# Patient Record
Sex: Male | Born: 1955
Health system: Southern US, Community
[De-identification: ages and names within clinical notes are randomized; demographics above are authoritative.]

## PROBLEM LIST (undated history)

## (undated) DIAGNOSIS — N200 Calculus of kidney: Secondary | ICD-10-CM

## (undated) DIAGNOSIS — E782 Mixed hyperlipidemia: Secondary | ICD-10-CM

## (undated) DIAGNOSIS — I2510 Atherosclerotic heart disease of native coronary artery without angina pectoris: Secondary | ICD-10-CM

## (undated) DIAGNOSIS — R072 Precordial pain: Secondary | ICD-10-CM

## (undated) DIAGNOSIS — I722 Aneurysm of renal artery: Secondary | ICD-10-CM

## (undated) DIAGNOSIS — Z87442 Personal history of urinary calculi: Secondary | ICD-10-CM

## (undated) DIAGNOSIS — I10 Essential (primary) hypertension: Secondary | ICD-10-CM

## (undated) DIAGNOSIS — K635 Polyp of colon: Secondary | ICD-10-CM

## (undated) HISTORY — DX: Mixed hyperlipidemia: E78.2

## (undated) HISTORY — DX: Precordial pain: R07.2

## (undated) HISTORY — DX: Polyp of colon: K63.5

## (undated) HISTORY — PX: TONSILLECTOMY: SUR1361

## (undated) HISTORY — DX: Aneurysm of renal artery: I72.2

## (undated) HISTORY — DX: Atherosclerotic heart disease of native coronary artery without angina pectoris: I25.10

## (undated) HISTORY — DX: Essential (primary) hypertension: I10

## (undated) HISTORY — DX: Calculus of kidney: N20.0

---

## 2007-07-10 DIAGNOSIS — K635 Polyp of colon: Secondary | ICD-10-CM

## 2007-07-10 HISTORY — DX: Polyp of colon: K63.5

## 2007-07-10 HISTORY — PX: COLONOSCOPY: SHX174

## 2007-12-09 ENCOUNTER — Ambulatory Visit: Payer: Self-pay | Admitting: General Surgery

## 2012-12-17 ENCOUNTER — Encounter: Payer: Self-pay | Admitting: General Surgery

## 2013-01-07 ENCOUNTER — Encounter: Payer: Self-pay | Admitting: General Surgery

## 2013-01-07 ENCOUNTER — Ambulatory Visit (INDEPENDENT_AMBULATORY_CARE_PROVIDER_SITE_OTHER): Payer: BC Managed Care – PPO | Admitting: General Surgery

## 2013-01-07 ENCOUNTER — Other Ambulatory Visit: Payer: Self-pay | Admitting: General Surgery

## 2013-01-07 VITALS — BP 124/88 | HR 78 | Resp 12 | Ht 69.0 in | Wt 179.0 lb

## 2013-01-07 DIAGNOSIS — D128 Benign neoplasm of rectum: Secondary | ICD-10-CM

## 2013-01-07 DIAGNOSIS — Z86010 Personal history of colonic polyps: Secondary | ICD-10-CM

## 2013-01-07 DIAGNOSIS — Z1211 Encounter for screening for malignant neoplasm of colon: Secondary | ICD-10-CM

## 2013-01-07 DIAGNOSIS — D129 Benign neoplasm of anus and anal canal: Secondary | ICD-10-CM

## 2013-01-07 DIAGNOSIS — K635 Polyp of colon: Secondary | ICD-10-CM | POA: Insufficient documentation

## 2013-01-07 MED ORDER — POLYETHYLENE GLYCOL 3350 17 GM/SCOOP PO POWD: ORAL | Status: DC

## 2013-01-07 NOTE — Patient Instructions (Addendum)
Colonoscopy A colonoscopy is an exam to evaluate your entire colon. In this exam, your colon is cleansed. A long fiberoptic tube is inserted through your rectum and into your colon. The fiberoptic scope (endoscope) is a long bundle of enclosed and very flexible fibers. These fibers transmit light to the area examined and send images from that area to your caregiver. Discomfort is usually minimal. You may be given a drug to help you sleep (sedative) during or prior to the procedure. This exam helps to detect lumps (tumors), polyps, inflammation, and areas of bleeding. Your caregiver may also take a small piece of tissue (biopsy) that will be examined under a microscope. LET YOUR CAREGIVER KNOW ABOUT:   Allergies to food or medicine.  Medicines taken, including vitamins, herbs, eyedrops, over-the-counter medicines, and creams.  Use of steroids (by mouth or creams).  Previous problems with anesthetics or numbing medicines.  History of bleeding problems or blood clots.  Previous surgery.  Other health problems, including diabetes and kidney problems.  Possibility of pregnancy, if this applies. BEFORE THE PROCEDURE   A clear liquid diet may be required for 2 days before the exam.  Ask your caregiver about changing or stopping your regular medications.  Liquid injections (enemas) or laxatives may be required.  A large amount of electrolyte solution may be given to you to drink over a short period of time. This solution is used to clean out your colon.  You should be present 60 minutes prior to your procedure or as directed by your caregiver. AFTER THE PROCEDURE   If you received a sedative or pain relieving medication, you will need to arrange for someone to drive you home.  Occasionally, there is a little blood passed with the first bowel movement. Do not be concerned. FINDING OUT THE RESULTS OF YOUR TEST Not all test results are available during your visit. If your test results are  not back during the visit, make an appointment with your caregiver to find out the results. Do not assume everything is normal if you have not heard from your caregiver or the medical facility. It is important for you to follow up on all of your test results. HOME CARE INSTRUCTIONS   It is not unusual to pass moderate amounts of gas and experience mild abdominal cramping following the procedure. This is due to air being used to inflate your colon during the exam. Walking or a warm pack on your belly (abdomen) may help.  You may resume all normal meals and activities after sedatives and medicines have worn off.  Only take over-the-counter or prescription medicines for pain, discomfort, or fever as directed by your caregiver. Do not use aspirin or blood thinners if a biopsy was taken. Consult your caregiver for medicine usage if biopsies were taken. SEEK IMMEDIATE MEDICAL CARE IF:   You have a fever.  You pass large blood clots or fill a toilet with blood following the procedure. This may also occur 10 to 14 days following the procedure. This is more likely if a biopsy was taken.  You develop abdominal pain that keeps getting worse and cannot be relieved with medicine. Document Released: 06/22/2000 Document Revised: 09/17/2011 Document Reviewed: 02/05/2008 [facility] Patient Information 2014 [place], [place].  Patient has been scheduled for a colonoscopy on 01-20-13 at [facility].

## 2013-01-07 NOTE — Progress Notes (Signed)
Patient ID: Bryan Simpson, male   DOB: 12/25/1955, 57 y.o.   MRN: [id number]  Chief Complaint  Patient presents with  . Other    colonoscopy    HPI Bryan Simpson is a 57 y.o. male here today for an colonoscopy.Patient has history of colon polyps in 2009. No current problems with his bowels.   HPI  Past Medical History  Diagnosis Date  . Kidney stones   . Colon polyps 2009    Past Surgical History  Procedure Laterality Date  . Colonoscopy  2009  . Tonsillectomy  age 57    History reviewed. No pertinent family history.  Social History History  Substance Use Topics  . Smoking status: Never Smoker   . Smokeless tobacco: Never Used  . Alcohol Use: Yes    No Known Allergies  Current Outpatient Prescriptions  Medication Sig Dispense Refill  . polyethylene glycol powder (GLYCOLAX/MIRALAX) powder 255 grams one bottle for colonoscopy prep  255 g  0   No current facility-administered medications for this visit.    Review of Systems Review of Systems  Constitutional: Negative.   Respiratory: Negative.   Cardiovascular: Negative.   Gastrointestinal: Negative.     Blood pressure 124/88, pulse 78, resp. rate 12, height 5\' 9"  (1.753 m), weight 179 lb (81.194 kg).  Physical Exam Physical Exam  Constitutional: He is oriented to person, place, and time. He appears well-developed and well-nourished.  Cardiovascular: Normal rate, regular rhythm and normal heart sounds.   Pulmonary/Chest: Breath sounds normal.  Neurological: He is alert and oriented to person, place, and time.  Skin: Skin is warm and dry.    Data Reviewed Colonoscopy completed December 09, 1998 that showed a 5 mm sessile polyp in the cecum (2 adenoma without high-grade dysplasia) as well as a pedunculated 10 mm polyp in the rectosigmoid which represented a tubulovillous adenoma.  Assessment    Previous colonic polyps.     Plan    The indications for repeat colonoscopy if it reviewed. The patient amenable  to proceed.     Patient has been scheduled for a colonoscopy on 01-20-13 at [facility].    Bryan Simpson 01/07/2013, 8:26 PM

## 2013-01-20 ENCOUNTER — Ambulatory Visit: Payer: Self-pay | Admitting: General Surgery

## 2013-01-20 DIAGNOSIS — Z86010 Personal history of colonic polyps: Secondary | ICD-10-CM

## 2013-07-15 ENCOUNTER — Encounter: Payer: Self-pay | Admitting: General Surgery

## 2019-07-31 ENCOUNTER — Other Ambulatory Visit: Payer: Self-pay | Admitting: Internal Medicine

## 2019-07-31 DIAGNOSIS — R1013 Epigastric pain: Secondary | ICD-10-CM

## 2019-08-06 ENCOUNTER — Other Ambulatory Visit: Payer: Self-pay

## 2019-08-06 ENCOUNTER — Ambulatory Visit
Admission: RE | Admit: 2019-08-06 | Discharge: 2019-08-06 | Disposition: A | Discharge status: Home / Self Care | Payer: Medicare HMO | Source: Ambulatory Visit | Attending: Internal Medicine | Admitting: Internal Medicine

## 2019-08-06 DIAGNOSIS — R1013 Epigastric pain: Secondary | ICD-10-CM | POA: Diagnosis present

## 2019-08-13 ENCOUNTER — Other Ambulatory Visit: Payer: Self-pay | Admitting: General Surgery

## 2019-08-13 DIAGNOSIS — R1012 Left upper quadrant pain: Secondary | ICD-10-CM

## 2019-08-13 NOTE — Progress Notes (Signed)
[id number] 

## 2019-08-17 ENCOUNTER — Other Ambulatory Visit
Admission: RE | Admit: 2019-08-17 | Discharge: 2019-08-17 | Disposition: A | Discharge status: Home / Self Care | Payer: Medicare HMO | Source: Ambulatory Visit | Attending: General Surgery | Admitting: General Surgery

## 2019-08-17 ENCOUNTER — Other Ambulatory Visit: Payer: Self-pay | Admitting: General Surgery

## 2019-08-17 DIAGNOSIS — R1012 Left upper quadrant pain: Secondary | ICD-10-CM

## 2019-08-17 LAB — CBC WITH DIFFERENTIAL/PLATELET
Abs Immature Granulocytes: 0 10*3/uL (ref 0.00–0.07)
Basophils Absolute: 0 10*3/uL (ref 0.0–0.1)
Basophils Relative: 1 %
Eosinophils Absolute: 0.2 10*3/uL (ref 0.0–0.5)
Eosinophils Relative: 4 %
HCT: 44.2 % (ref 39.0–52.0)
Hemoglobin: 15 g/dL (ref 13.0–17.0)
Immature Granulocytes: 0 %
Lymphocytes Relative: 29 %
Lymphs Abs: 1.2 10*3/uL (ref 0.7–4.0)
MCH: 32.6 pg (ref 26.0–34.0)
MCHC: 33.9 g/dL (ref 30.0–36.0)
MCV: 96.1 fL (ref 80.0–100.0)
Monocytes Absolute: 0.5 10*3/uL (ref 0.1–1.0)
Monocytes Relative: 12 %
Neutro Abs: 2.4 10*3/uL (ref 1.7–7.7)
Neutrophils Relative %: 54 %
Platelets: 227 10*3/uL (ref 150–400)
RBC: 4.6 MIL/uL (ref 4.22–5.81)
RDW: 12 % (ref 11.5–15.5)
WBC: 4.3 10*3/uL (ref 4.0–10.5)
nRBC: 0 % (ref 0.0–0.2)

## 2019-08-17 LAB — COMPREHENSIVE METABOLIC PANEL
ALT: 18 U/L (ref 0–44)
AST: 21 U/L (ref 15–41)
Albumin: 4.1 g/dL (ref 3.5–5.0)
Alkaline Phosphatase: 41 U/L (ref 38–126)
Anion gap: 11 (ref 5–15)
BUN: 13 mg/dL (ref 8–23)
CO2: 26 mmol/L (ref 22–32)
Calcium: 9.3 mg/dL (ref 8.9–10.3)
Chloride: 99 mmol/L (ref 98–111)
Creatinine, Ser: 1.06 mg/dL (ref 0.61–1.24)
GFR calc Af Amer: >60 mL/min (ref >60)
GFR calc non Af Amer: >60 mL/min (ref >60)
Glucose, Bld: 109 mg/dL — ABNORMAL HIGH (ref 70–99)
Potassium: 4.2 mmol/L (ref 3.5–5.1)
Sodium: 136 mmol/L (ref 135–145)
Total Bilirubin: 0.7 mg/dL (ref 0.3–1.2)
Total Protein: 6.9 g/dL (ref 6.5–8.1)

## 2019-08-17 LAB — LIPASE, BLOOD: Lipase: 20 U/L (ref 11–51)

## 2019-08-17 LAB — AMYLASE: Amylase: 42 U/L (ref 28–100)

## 2019-08-17 NOTE — Progress Notes (Signed)
cbc

## 2019-08-18 ENCOUNTER — Ambulatory Visit: Admission: RE | Admit: 2019-08-18 | Payer: Medicare HMO | Source: Ambulatory Visit

## 2019-08-19 ENCOUNTER — Ambulatory Visit
Admission: RE | Admit: 2019-08-19 | Discharge: 2019-08-19 | Disposition: A | Discharge status: Home / Self Care | Payer: Medicare HMO | Source: Ambulatory Visit | Attending: General Surgery | Admitting: General Surgery

## 2019-08-19 ENCOUNTER — Other Ambulatory Visit: Payer: Self-pay

## 2019-08-19 DIAGNOSIS — R1012 Left upper quadrant pain: Secondary | ICD-10-CM | POA: Diagnosis present

## 2019-08-19 MED ORDER — IOHEXOL 300 MG/ML  SOLN
100.0000 mL | Freq: Once | INTRAMUSCULAR | Status: AC | PRN
  Administered 2019-08-19: 100 mL via INTRAVENOUS

## 2020-04-25 ENCOUNTER — Ambulatory Visit: Payer: Medicare HMO | Attending: Internal Medicine

## 2020-04-25 DIAGNOSIS — Z23 Encounter for immunization: Secondary | ICD-10-CM

## 2020-04-25 NOTE — Progress Notes (Signed)
   Covid-19 Vaccination Clinic  Name:  Bryan Simpson    MRN: [id number] DOB: 18-Feb-1956  04/25/2020  [name] was observed post Covid-19 immunization for 15 minutes without incident. He was provided with Vaccine Information Sheet and instruction to access the V-Safe system.   [name] was instructed to call 911 with any severe reactions post vaccine: [name] Difficulty breathing  . Swelling of face and throat  . A fast heartbeat  . A bad rash all over body  . Dizziness and weakness

## 2020-07-13 ENCOUNTER — Other Ambulatory Visit: Payer: Self-pay

## 2020-07-13 ENCOUNTER — Encounter: Payer: Self-pay | Admitting: Internal Medicine

## 2020-07-13 ENCOUNTER — Ambulatory Visit: Payer: Medicare HMO | Admitting: Internal Medicine

## 2020-07-13 VITALS — BP 140/102 | HR 78 | Ht 69.0 in | Wt 202.4 lb

## 2020-07-13 DIAGNOSIS — R079 Chest pain, unspecified: Secondary | ICD-10-CM

## 2020-07-13 DIAGNOSIS — I10 Essential (primary) hypertension: Secondary | ICD-10-CM

## 2020-07-13 MED ORDER — METOPROLOL TARTRATE 100 MG PO TABS: 100.0000 mg | ORAL_TABLET | Freq: Once | ORAL | 0 refills | Status: DC

## 2020-07-13 NOTE — Progress Notes (Signed)
New Outpatient Visit Date: 07/13/2020  Referring Provider: Albina Billet, MD [address]   [place],  [place] [address]  Chief Complaint: Chest pain  HPI:  Bryan Simpson is a 65 y.o. male who is being seen today as a self-referral for evaluation of chest pain. He has a history of hypertension and kidney stones.  Over the last 3 years, he has experienced intermittent chest pain under the left breast.  He reports that it is sharp without radiation or associated symptoms.  It often comes on after eating or drinking, especially alcohol.  He was previously on pantoprazole which seemed to help the symptoms.  However, over the last few weeks, he has continued to have the discomfort despite taking the pantoprazole regularly.  It also seems to be worsened when he sits up and moves his chest in certain ways.  He denies exertional chest pain as well as shortness of breath, palpitations, lightheadedness, or edema.  Bryan Simpson reports having undergone an exercise Myoview several years ago with Dr. Clayborn Bigness and believes it was normal.  He denies other heart testing or cardiac disease in the past.  --------------------------------------------------------------------------------------------------  Cardiovascular History & Procedures: Cardiovascular Problems:  Atypical chest pain  Risk Factors:  Hypertension, male gender, and age greater than 68  Cath/PCI:  None  CV Surgery:  None  EP Procedures and Devices:  None  Non-Invasive Evaluation(s):  None available.  Patient reports negative exercise myocardial perfusion stress test over 20 years ago.  Recent CV Pertinent Labs: Lab Results  Component Value Date   K 4.2 08/17/2019   BUN 13 08/17/2019   CREATININE 1.06 08/17/2019    --------------------------------------------------------------------------------------------------  Past Medical History:  Diagnosis Date  . Colon polyps 2009  . Kidney stones     Past Surgical History:   Procedure Laterality Date  . COLONOSCOPY  2009  . TONSILLECTOMY  age 65    Current Meds  Medication Sig  . lisinopril (ZESTRIL) 5 MG tablet Take 5 mg by mouth daily.  . pantoprazole (PROTONIX) 40 MG tablet Take 40 mg by mouth daily.    Allergies: Patient has no known allergies.  Social History   Tobacco Use  . Smoking status: Never Smoker  . Smokeless tobacco: Never Used  Substance Use Topics  . Alcohol use: Yes  . Drug use: No    Family History  Problem Relation Age of Onset  . Heart Problems Father     Review of Systems: A 12-system review of systems was performed and was negative except as noted in the HPI.  --------------------------------------------------------------------------------------------------  Physical Exam: BP (!) 140/102 (BP Location: Right Arm, Patient Position: Sitting, Cuff Size: Normal)   Pulse 78   Ht 5\' 9"  (1.753 m)   Wt 202 lb 6 oz (91.8 kg)   SpO2 98%   BMI 29.89 kg/m   General: NAD. HEENT: No conjunctival pallor or scleral icterus. Facemask in place. Neck: Supple without lymphadenopathy, thyromegaly, JVD, or HJR. No carotid bruit. Lungs: Normal work of breathing. Clear to auscultation bilaterally without wheezes or crackles. Heart: Regular rate and rhythm without murmurs, rubs, or gallops. Non-displaced PMI. Abd: Bowel sounds present. Soft, NT/ND without hepatosplenomegaly Ext: No lower extremity edema. Radial, PT, and DP pulses are 2+ bilaterally Skin: Warm and dry without rash. Neuro: CNIII-XII intact. Strength and fine-touch sensation intact in upper and lower extremities bilaterally. Psych: Normal mood and affect.  EKG: Normal sinus rhythm with first-degree AV block and left axis deviation.  Lab Results  Component Value Date   WBC 4.3 08/17/2019   HGB 15.0 08/17/2019   HCT 44.2 08/17/2019   MCV 96.1 08/17/2019   PLT 227 08/17/2019    Lab Results  Component Value Date   NA 136 08/17/2019   K 4.2 08/17/2019   CL 99  08/17/2019   CO2 26 08/17/2019   BUN 13 08/17/2019   CREATININE 1.06 08/17/2019   GLUCOSE 109 (H) 08/17/2019   ALT 18 08/17/2019     --------------------------------------------------------------------------------------------------  ASSESSMENT AND PLAN: Atypical chest pain: Chest pain is not typical for angina given that it seems to be worsened by alcohol consumption and certain movements.  It has also responded in the past to PPI use, though pantoprazole has not seemed to help as much recently.  Cardiac risk factors include hypertension, gender, and age.  We have discussed further evaluation options and have agreed to obtain a coronary CTA to evaluate for atherosclerotic coronary artery disease.  I will defer adding aspirin at this time given the atypical nature of the symptoms and concern that GI pathology may be contributing to his discomfort.  He should continue his PPI.  EGD should be considered if symptoms persist and cardiac CTA is unrevealing.  Hypertension: Blood pressure suboptimally controlled today but typically better.  We will defer making changes at this time, though escalation of lisinopril will need to be considered in the future if blood pressure remains above 140/90.  Follow-up: Return to clinic shortly after CTA.  Yvonne Kendall, MD 07/13/2020 2:12 PM

## 2020-07-13 NOTE — Patient Instructions (Addendum)
Medication Instructions:  Your physician recommends that you continue on your current medications as directed. Please refer to the Current Medication list given to you today.  *If you need a refill on your cardiac medications before your next appointment, please call your pharmacy*   Lab Work: Your physician recommends that you return for lab work in: TODAy - BMP.  If you have labs (blood work) drawn today and your tests are completely normal, you will receive your results only by: [name] MyChart Message (if you have MyChart) OR . A paper copy in the mail If you have any lab test that is abnormal or we need to change your treatment, we will call you to review the results.   Testing/Procedures:  Your cardiac CT will be scheduled at one of the below locations:   [facility] [address] [place], [place] [address] [phone]  [place] [address] [place], [place] [address] [phone]  If scheduled at [facility], please arrive at the [facility] main entrance of [facility] 30 minutes prior to test start time. Proceed to the [facility] Radiology Department (first floor) to check-in and test prep.  If scheduled at [facility], please arrive 15 mins early for check-in and test prep.  Please follow these instructions carefully (unless otherwise directed):  Hold all erectile dysfunction medications at least 3 days (72 hrs) prior to test.  On the Night Before the Test: . Be sure to Drink plenty of water. . Do not consume any caffeinated/decaffeinated beverages or chocolate 12 hours prior to your test. . Do not take any antihistamines 12 hours prior to your test.  On the Day of the Test: . Drink plenty of water. Do not drink any water within one hour of the test. . Do not eat any food 4 hours prior to the test. . You may take your regular medications prior to the  test.  . Take metoprolol (Lopressor) two hours prior to test. - Lopressor 100 mg tablet  After the Test: . Drink plenty of water. . After receiving IV contrast, you may experience a mild flushed feeling. This is normal. . On occasion, you may experience a mild rash up to 24 hours after the test. This is not dangerous. If this occurs, you can take Benadryl 25 mg and increase your fluid intake. . If you experience trouble breathing, this can be serious. If it is severe call 911 IMMEDIATELY. If it is mild, please call our office. . If you take any of these medications: Glipizide/Metformin, Avandament, Glucavance, please do not take 48 hours after completing test unless otherwise instructed.   Once we have confirmed authorization from your insurance company, we will call you to set up a date and time for your test. Based on how quickly your insurance processes prior authorizations requests, please allow up to 4 weeks to be contacted for scheduling your Cardiac CT appointment. Be advised that routine Cardiac CT appointments could be scheduled as many as 8 weeks after your provider has ordered it.  For non-scheduling related questions, please contact the cardiac imaging nurse navigator should you have any questions/concerns: Marchia Bond, Cardiac Imaging Nurse Navigator Burley Saver, Interim Cardiac Imaging Nurse [facility] Direct Office Dial: [phone]   For scheduling needs, including cancellations and rescheduling, please call [place], [phone].   Follow-Up: At [facility], you and your health needs are our priority.  As part of our continuing mission to provide you with exceptional heart care, we have created designated Provider Care Teams.  These Care Teams include your primary Cardiologist (physician) and Advanced Practice Providers (APPs -  Physician Assistants and Nurse Practitioners) who all work together to provide you with the care you need,  when you need it.  We recommend signing up for the patient portal called "MyChart".  Sign up information is provided on this After Visit Summary.  MyChart is used to connect with patients for Virtual Visits (Telemedicine).  Patients are able to view lab/test results, encounter notes, upcoming appointments, etc.  Non-urgent messages can be sent to your provider as well.   To learn more about what you can do with MyChart, go to [email or web address].    Your next appointment:   After Cardiac CTA approximately 4-6 weeks.  The format for your next appointment:   In Person  Provider:   You may see DR Harrell Gave END or one of the following Advanced Practice Providers on your designated Care Team:    Murray Hodgkins, NP  Christell Faith, PA-C  Marrianne Mood, PA-C  Cadence [place], [place]  Laurann Montana, NP    Cardiac CT Angiogram A cardiac CT angiogram is a procedure to look at the heart and the area around the heart. It may be done to help find the cause of chest pains or other symptoms of heart disease. During this procedure, a substance called contrast dye is injected into the blood vessels in the area to be checked. A large X-ray machine, called a CT scanner, then takes detailed pictures of the heart and the surrounding area. The procedure is also sometimes called a coronary CT angiogram, coronary artery scanning, or CTA. A cardiac CT angiogram allows the health care provider to see how well blood is flowing to and from the heart. The health care provider will be able to see if there are any problems, such as:  Blockage or narrowing of the coronary arteries in the heart.  Fluid around the heart.  Signs of weakness or disease in the muscles, valves, and tissues of the heart. Tell a health care provider about:  Any allergies you have. This is especially important if you have had a previous allergic reaction to contrast dye.  All medicines you are taking, including vitamins, herbs,  eye drops, creams, and over-the-counter medicines.  Any blood disorders you have.  Any surgeries you have had.  Any medical conditions you have.  Whether you are pregnant or may be pregnant.  Any anxiety disorders, chronic pain, or other conditions you have that may increase your stress or prevent you from lying still. What are the risks? Generally, this is a safe procedure. However, problems may occur, including:  Bleeding.  Infection.  Allergic reactions to medicines or dyes.  Damage to other structures or organs.  Kidney damage from the contrast dye that is used.  Increased risk of cancer from radiation exposure. This risk is low. Talk with your health care provider about: ? The risks and benefits of testing. ? How you can receive the lowest dose of radiation. What happens before the procedure?  Wear comfortable clothing and remove any jewelry, glasses, dentures, and hearing aids.  Follow instructions from your health care provider about eating and drinking. This may include: ? For 12 hours before the procedure -- avoid caffeine. This includes tea, coffee, soda, energy drinks, and diet pills. Drink plenty of water or other fluids that do not have caffeine  in them. Being well hydrated can prevent complications. ? For 4-6 hours before the procedure -- stop eating and drinking. The contrast dye can cause nausea, but this is less likely if your stomach is empty.  Ask your health care provider about changing or stopping your regular medicines. This is especially important if you are taking diabetes medicines, blood thinners, or medicines to treat problems with erections (erectile dysfunction). What happens during the procedure?   Hair on your chest may need to be removed so that small sticky patches called electrodes can be placed on your chest. These will transmit information that helps to monitor your heart during the procedure.  An IV will be inserted into one of your  veins.  You might be given a medicine to control your heart rate during the procedure. This will help to ensure that good images are obtained.  You will be asked to lie on an exam table. This table will slide in and out of the CT machine during the procedure.  Contrast dye will be injected into the IV. You might feel warm, or you may get a metallic taste in your mouth.  You will be given a medicine called nitroglycerin. This will relax or dilate the arteries in your heart.  The table that you are lying on will move into the CT machine tunnel for the scan.  The person running the machine will give you instructions while the scans are being done. You may be asked to: ? Keep your arms above your head. ? Hold your breath. ? Stay very still, even if the table is moving.  When the scanning is complete, you will be moved out of the machine.  The IV will be removed. The procedure may vary among health care providers and hospitals. What can I expect after the procedure? After your procedure, it is common to have:  A metallic taste in your mouth from the contrast dye.  A feeling of warmth.  A headache from the nitroglycerin. Follow these instructions at home:  Take over-the-counter and prescription medicines only as told by your health care provider.  If you are told, drink enough fluid to keep your urine pale yellow. This will help to flush the contrast dye out of your body.  Most people can return to their normal activities right after the procedure. Ask your health care provider what activities are safe for you.  It is up to you to get the results of your procedure. Ask your health care provider, or the department that is doing the procedure, when your results will be ready.  Keep all follow-up visits as told by your health care provider. This is important. Contact a health care provider if:  You have any symptoms of allergy to the contrast dye. These include: ? Shortness of  breath. ? Rash or hives. ? A racing heartbeat. Summary  A cardiac CT angiogram is a procedure to look at the heart and the area around the heart. It may be done to help find the cause of chest pains or other symptoms of heart disease.  During this procedure, a large X-ray machine, called a CT scanner, takes detailed pictures of the heart and the surrounding area after a contrast dye has been injected into blood vessels in the area.  Ask your health care provider about changing or stopping your regular medicines before the procedure. This is especially important if you are taking diabetes medicines, blood thinners, or medicines to treat erectile dysfunction.  If  you are told, drink enough fluid to keep your urine pale yellow. This will help to flush the contrast dye out of your body. This information is not intended to replace advice given to you by your health care provider. Make sure you discuss any questions you have with your health care provider. Document Revised: 02/18/2019 Document Reviewed: 02/18/2019 Elsevier Patient Education  [employer].

## 2020-07-14 ENCOUNTER — Encounter: Payer: Self-pay | Admitting: Internal Medicine

## 2020-07-14 DIAGNOSIS — I10 Essential (primary) hypertension: Secondary | ICD-10-CM | POA: Insufficient documentation

## 2020-07-14 DIAGNOSIS — R079 Chest pain, unspecified: Secondary | ICD-10-CM | POA: Insufficient documentation

## 2020-07-14 LAB — BASIC METABOLIC PANEL WITH GFR
BUN/Creatinine Ratio: 9 — ABNORMAL LOW (ref 10–24)
BUN: 13 mg/dL (ref 8–27)
CO2: 24 mmol/L (ref 20–29)
Calcium: 10 mg/dL (ref 8.6–10.2)
Chloride: 103 mmol/L (ref 96–106)
Creatinine, Ser: 1.38 mg/dL — ABNORMAL HIGH (ref 0.76–1.27)
GFR calc Af Amer: 61 mL/min/1.73
GFR calc non Af Amer: 53 mL/min/1.73 — ABNORMAL LOW
Glucose: 111 mg/dL — ABNORMAL HIGH (ref 65–99)
Potassium: 4.7 mmol/L (ref 3.5–5.2)
Sodium: 142 mmol/L (ref 134–144)

## 2020-07-15 NOTE — Addendum Note (Signed)
Addended by: Britt Bottom on: 07/15/2020 01:34 PM   Modules accepted: Orders

## 2020-07-22 ENCOUNTER — Encounter: Payer: Self-pay | Admitting: Internal Medicine

## 2020-07-22 DIAGNOSIS — I10 Essential (primary) hypertension: Secondary | ICD-10-CM | POA: Diagnosis not present

## 2020-07-22 DIAGNOSIS — Z125 Encounter for screening for malignant neoplasm of prostate: Secondary | ICD-10-CM | POA: Diagnosis not present

## 2020-07-22 DIAGNOSIS — E785 Hyperlipidemia, unspecified: Secondary | ICD-10-CM | POA: Diagnosis not present

## 2020-07-22 DIAGNOSIS — Z0000 Encounter for general adult medical examination without abnormal findings: Secondary | ICD-10-CM | POA: Diagnosis not present

## 2020-08-02 ENCOUNTER — Telehealth (HOSPITAL_COMMUNITY): Payer: Self-pay | Admitting: *Deleted

## 2020-08-02 NOTE — Telephone Encounter (Signed)
Attempted to call patient regarding upcoming cardiac CT appointment. Left message on voicemail with name and callback number  Michelle Vanhise RN Navigator Cardiac Imaging [facility] [phone] Office [phone] Cell  

## 2020-08-04 ENCOUNTER — Other Ambulatory Visit: Payer: Self-pay

## 2020-08-04 ENCOUNTER — Ambulatory Visit
Admission: RE | Admit: 2020-08-04 | Discharge: 2020-08-04 | Disposition: A | Discharge status: Home / Self Care | Payer: Medicare HMO | Source: Ambulatory Visit | Attending: Internal Medicine | Admitting: Internal Medicine

## 2020-08-04 DIAGNOSIS — I2510 Atherosclerotic heart disease of native coronary artery without angina pectoris: Secondary | ICD-10-CM | POA: Diagnosis not present

## 2020-08-04 DIAGNOSIS — R079 Chest pain, unspecified: Secondary | ICD-10-CM | POA: Diagnosis present

## 2020-08-04 HISTORY — DX: Personal history of urinary calculi: Z87.442

## 2020-08-04 MED ORDER — IOHEXOL 350 MG/ML SOLN
75.0000 mL | Freq: Once | INTRAVENOUS | Status: AC | PRN
  Administered 2020-08-04: 75 mL via INTRAVENOUS

## 2020-08-04 MED ORDER — NITROGLYCERIN 0.4 MG SL SUBL
0.8000 mg | SUBLINGUAL_TABLET | Freq: Once | SUBLINGUAL | Status: AC
  Administered 2020-08-04: 0.8 mg via SUBLINGUAL

## 2020-08-04 NOTE — Progress Notes (Signed)
Patient tolerated procedure well. Ambulate w/o difficulty. Sitting in chair drinking water provided. Encouraged to drink extra water today and reasoning explained. Verbalized understanding. All questions answered. ABC intact. No further needs. Discharge from procedure area w/o issues.  

## 2020-08-12 ENCOUNTER — Other Ambulatory Visit: Payer: Self-pay

## 2020-08-12 ENCOUNTER — Ambulatory Visit: Payer: Medicare HMO | Admitting: Internal Medicine

## 2020-08-12 ENCOUNTER — Encounter: Payer: Self-pay | Admitting: Internal Medicine

## 2020-08-12 VITALS — BP 138/98 | HR 73 | Ht 69.0 in | Wt 206.2 lb

## 2020-08-12 DIAGNOSIS — I10 Essential (primary) hypertension: Secondary | ICD-10-CM

## 2020-08-12 DIAGNOSIS — I2510 Atherosclerotic heart disease of native coronary artery without angina pectoris: Secondary | ICD-10-CM | POA: Diagnosis not present

## 2020-08-12 DIAGNOSIS — R079 Chest pain, unspecified: Secondary | ICD-10-CM

## 2020-08-12 NOTE — Progress Notes (Signed)
Follow-up Outpatient Visit Date: 08/12/2020  Primary Care Provider: Albina Billet, MD [address]   [place] [address]  Chief Complaint: Chest pain  HPI:  Bryan Simpson is a 65 y.o. male with history of hypertension and kidney stone, who presents for follow-up of chest pain.  I met him last month for evaluation of atypical chest pain.  Coronary CTA last week showed moderate coronary artery calcification and mild, nonobstructive CAD.  Today, Bryan Simpson continues to have pain in the left side of his chest along the sternum and lower costal margin.  It seems to worsen with certain movements as well as eating.  He recently noticed worsening pain after eating peanuts.  He denies shortness of breath, palpitations, lightheadedness, and edema.  He hopes to lose some weight and thinks this may help some of his pain.  --------------------------------------------------------------------------------------------------  Cardiovascular History & Procedures: Cardiovascular Problems:  Atypical chest pain  Risk Factors:  Hypertension, male gender, and age greater than 63  Cath/PCI:  None  CV Surgery:  None  EP Procedures and Devices:  None  Non-Invasive Evaluation(s):  Coronary CTA (08/04/2020): LMCA normal.  LAD with proximal and mid calcification with less than 25% stenosis.  Nondominant LCx without plaque.  Dominant RCA with minimal calcification and less than 25% stenosis.  CAC score 253 (69th percentile).  Recent CV Pertinent Labs: Lab Results  Component Value Date   K 4.7 07/13/2020   BUN 13 07/13/2020   CREATININE 1.38 (H) 07/13/2020    Past medical and surgical history were reviewed and updated in EPIC.  Current Meds  Medication Sig  . lisinopril (ZESTRIL) 5 MG tablet Take 5 mg by mouth daily.  . pantoprazole (PROTONIX) 40 MG tablet Take 40 mg by mouth daily.    Allergies: Patient has no known allergies.  Social History   Tobacco Use  . Smoking status:  Never Smoker  . Smokeless tobacco: Never Used  Substance Use Topics  . Alcohol use: Yes    Alcohol/week: 1.0 standard drink    Types: 1 Glasses of wine per week  . Drug use: No    Family History  Problem Relation Age of Onset  . Heart Problems Father 62       Heart failure    Review of Systems: A 12-system review of systems was performed and was negative except as noted in the HPI.  --------------------------------------------------------------------------------------------------  Physical Exam: BP (!) 138/98 (BP Location: Left Arm, Patient Position: Sitting, Cuff Size: Normal)   Pulse 73   Ht $R'5\' 9"'bZ$  (1.753 m)   Wt 206 lb 4 oz (93.6 kg)   SpO2 99%   BMI 30.46 kg/m   General:  NAD. Neck: No JVD or HJR. Lungs: Clear to auscultation bilaterally without wheezes or crackles. Heart: Regular rate and rhythm without murmurs, rubs, or gallops. Abdomen: Soft, nontender, nondistended. Extremities: No lower extremity edema.  EKG:  NSR with borderline 1st degree AV block (PR interval 216 ms) and left axis deviation.  No change from prior tracing on 07/13/20.  Lab Results  Component Value Date   WBC 4.3 08/17/2019   HGB 15.0 08/17/2019   HCT 44.2 08/17/2019   MCV 96.1 08/17/2019   PLT 227 08/17/2019    Lab Results  Component Value Date   NA 142 07/13/2020   K 4.7 07/13/2020   CL 103 07/13/2020   CO2 24 07/13/2020   BUN 13 07/13/2020   CREATININE 1.38 (H) 07/13/2020   GLUCOSE 111 (H)  07/13/2020   ALT 18 08/17/2019    No results found for: CHOL, HDL, LDLCALC, LDLDIRECT, TRIG, CHOLHDL  --------------------------------------------------------------------------------------------------  ASSESSMENT AND PLAN: Chest pain: Pain is atypical and most likely MSK or GI in nature.  Recent cardiac CTA showed mild, non-obstructive CAD.  I have encouraged Bryan Simpson to continue taking pantoprazole and to follow-up with Dr. Hall Busing and his gastroenterologist for further evaluation of the  chest pain if it does not improve.  Hypertension: BP mildly elevated today.  I have encouraged Bryan Simpson to minimize his sodium intake as well as to work on weight loss through diet and exercise.  We will defer medication changes today.  CAD: Mild coronary plaquing noted (<25% stenosis) on recent coronary CTA.  We have agreed to work on lifestyle modifications and consider addition of a statin in the future based on response.  Target LDL <100; we will plan to check a fasting lipid panel and CMP shortly before our next visit in ~6 months.  Follow-up: Return to clinic in 6 months.  Nelva Bush, MD 08/12/2020 3:16 PM

## 2020-08-12 NOTE — Patient Instructions (Signed)
Medication Instructions:  No changes  *If you need a refill on your cardiac medications before your next appointment, please call your pharmacy*   Lab Work: Lipid panel & CMP to be done before next visit.  Make sure to not eat or drink anything after midnight prior to having them done. Go to [facility] entrance to have these done and no appointment is needed for this.    If you have labs (blood work) drawn today and your tests are completely normal, you will receive your results only by: [name] MyChart Message (if you have MyChart) OR . A paper copy in the mail If you have any lab test that is abnormal or we need to change your treatment, we will call you to review the results.   Testing/Procedures: None   Follow-Up: At [facility], you and your health needs are our priority.  As part of our continuing mission to provide you with exceptional heart care, we have created designated Provider Care Teams.  These Care Teams include your primary Cardiologist (physician) and Advanced Practice Providers (APPs -  Physician Assistants and Nurse Practitioners) who all work together to provide you with the care you need, when you need it.  Your next appointment:   6 month(s)  The format for your next appointment:   In Person  Provider:   You may see Dr. Harrell Gave End or one of the following Advanced Practice Providers on your designated Care Team:    Murray Hodgkins, NP  Christell Faith, PA-C  Marrianne Mood, PA-C  Cadence Norwood, [place]  Laurann Montana, NP

## 2020-08-14 ENCOUNTER — Encounter: Payer: Self-pay | Admitting: Internal Medicine

## 2020-08-14 DIAGNOSIS — I2510 Atherosclerotic heart disease of native coronary artery without angina pectoris: Secondary | ICD-10-CM | POA: Insufficient documentation

## 2020-08-24 NOTE — Addendum Note (Signed)
Addended by: Britt Bottom on: 08/24/2020 07:26 AM   Modules accepted: Orders

## 2020-11-18 DIAGNOSIS — M71341 Other bursal cyst, right hand: Secondary | ICD-10-CM | POA: Diagnosis not present

## 2020-11-18 DIAGNOSIS — Z08 Encounter for follow-up examination after completed treatment for malignant neoplasm: Secondary | ICD-10-CM | POA: Diagnosis not present

## 2020-11-18 DIAGNOSIS — L821 Other seborrheic keratosis: Secondary | ICD-10-CM | POA: Diagnosis not present

## 2020-11-18 DIAGNOSIS — D2272 Melanocytic nevi of left lower limb, including hip: Secondary | ICD-10-CM | POA: Diagnosis not present

## 2020-11-18 DIAGNOSIS — D2271 Melanocytic nevi of right lower limb, including hip: Secondary | ICD-10-CM | POA: Diagnosis not present

## 2020-11-18 DIAGNOSIS — D2262 Melanocytic nevi of left upper limb, including shoulder: Secondary | ICD-10-CM | POA: Diagnosis not present

## 2020-11-18 DIAGNOSIS — D2261 Melanocytic nevi of right upper limb, including shoulder: Secondary | ICD-10-CM | POA: Diagnosis not present

## 2020-11-18 DIAGNOSIS — Z872 Personal history of diseases of the skin and subcutaneous tissue: Secondary | ICD-10-CM | POA: Diagnosis not present

## 2020-11-18 DIAGNOSIS — Z85828 Personal history of other malignant neoplasm of skin: Secondary | ICD-10-CM | POA: Diagnosis not present

## 2020-11-18 DIAGNOSIS — D225 Melanocytic nevi of trunk: Secondary | ICD-10-CM | POA: Diagnosis not present

## 2021-01-02 ENCOUNTER — Encounter: Payer: Self-pay | Admitting: Internal Medicine

## 2021-01-02 DIAGNOSIS — E785 Hyperlipidemia, unspecified: Secondary | ICD-10-CM | POA: Diagnosis not present

## 2021-01-10 DIAGNOSIS — I10 Essential (primary) hypertension: Secondary | ICD-10-CM | POA: Diagnosis not present

## 2021-01-10 DIAGNOSIS — E785 Hyperlipidemia, unspecified: Secondary | ICD-10-CM | POA: Diagnosis not present

## 2021-01-19 ENCOUNTER — Other Ambulatory Visit: Payer: Self-pay | Admitting: General Surgery

## 2021-01-19 DIAGNOSIS — R1013 Epigastric pain: Secondary | ICD-10-CM | POA: Diagnosis not present

## 2021-01-19 DIAGNOSIS — I722 Aneurysm of renal artery: Secondary | ICD-10-CM | POA: Diagnosis not present

## 2021-01-19 DIAGNOSIS — Z86010 Personal history of colonic polyps: Secondary | ICD-10-CM | POA: Diagnosis not present

## 2021-01-19 NOTE — Progress Notes (Signed)
Subjective:     Patient ID: Bryan Simpson is a 65 y.o. male.   HPI   The following portions of the patient's history were reviewed and updated as appropriate.   This an established patient is here today for: office visit. The patient is here today for a colonoscopy discussion. He reports he has bowel movements every 3 days. Patient denies any rectal bleeding. The patient had his last colonoscopy done on 01-20-13.    The patient reports improvement in epigastric discomfort since initiation of PPI.  Symptomatic with epigastric discomfort if he misses a dose.   Patient was seen in March 2011 in follow-up from an exam the month earlier.  He initially was reporting left epigastric pain that has been present over 3 years.  Nexium did not provide any relief.  No nausea or vomiting.  Some mild dysphagia noted at that time.   CT scan notable only for renal cysts as well as a 1.7 cm right renal artery aneurysm.        Chief Complaint  Patient presents with   Pre-op Exam      BP (!) 152/88   Pulse 66   Temp 36.6 C (97.9 F)   Ht 175.3 cm ($RemoveB'5\' 9"'wvsQtDiz$ )   Wt 89.8 kg (198 lb)   SpO2 99%   BMI 29.24 kg/m        Past Medical History:  Diagnosis Date   Adenomatous polyp of sigmoid colon 12/09/2007    5 mm sessile polyp in the cecum tubular adenoma without high-grade dysplasia) as well as a pedunculated 10 mm polyp in the rectosigmoid which represented a tubulovillous adenoma   History of kidney stones     Hypertension             Past Surgical History:  Procedure Laterality Date   COLONOSCOPY   2009   COLONOSCOPY   2014   TONSILLECTOMY        age 83          Social History           Socioeconomic History   Marital status: Married  Tobacco Use   Smoking status: Never Smoker   Smokeless tobacco: Never Used  Substance and Sexual Activity   Alcohol use: Yes      Comment: occasionally   Drug use: Never        No Known Allergies   Current Medications        Current  Outpatient Medications  Medication Sig Dispense Refill   lisinopriL (ZESTRIL) 5 MG tablet Take 5 mg by mouth once daily       pantoprazole (PROTONIX) 40 MG DR tablet Take 40 mg by mouth once daily       rosuvastatin (CRESTOR) 10 MG tablet Take 10 mg by mouth every evening        No current facility-administered medications for this visit.             Family History  Problem Relation Age of Onset   Ovarian cancer Mother     Other Father 29        heart problems   Brain cancer Sister     Melanoma Sister            Review of Systems  Constitutional: Negative for chills and fever.  Respiratory: Negative for cough.          Objective:   Physical Exam Constitutional:      Appearance: Normal appearance.  Cardiovascular:  Rate and Rhythm: Normal rate and regular rhythm.     Pulses: Normal pulses.     Heart sounds: Normal heart sounds.  Pulmonary:     Effort: Pulmonary effort is normal.     Breath sounds: Normal breath sounds.  Neurological:     Mental Status: He is alert and oriented to person, place, and time.  Psychiatric:        Mood and Affect: Mood normal.        Behavior: Behavior normal.          Labs and Radiology:    January 20, 2013 colonoscopy was normal.  The exam was completed for a personal history of colon polyps.     July 13, 2020 laboratory:   Component Ref Range & Units 6 mo ago 1 yr ago  Glucose 65 - 99 mg/dL 111 High   109 High  R   BUN 8 - 27 mg/dL 13  13 R   Creatinine, Ser 0.76 - 1.27 mg/dL 1.38 High   1.06 R   GFR calc non Af Amer >59 mL/min/1.73 53 Low   >60 R   GFR calc Af Amer >59 mL/min/1.73 61  >60 R   Comment: **In accordance with recommendations from the NKF-ASN Task force,**    [facility] is in the process of updating its eGFR calculation to the    2021 CKD-EPI creatinine equation that estimates kidney function    without a race variable.   BUN/Creatinine Ratio 10 - 24 9 Low      Sodium 134 - 144 mmol/L 142  136 R   Potassium  3.5 - 5.2 mmol/L 4.7  4.2 R   Chloride 96 - 106 mmol/L 103  99 R   CO2 20 - 29 mmol/L 24  26 R   Calcium 8.6 - 10.2 mg/dL 10.0  9.3 R          Assessment:     Candidate for follow-up colonoscopy based on 2009 exam and identification of a tubular adenoma.  Normal exam in 2014.   Candidate for upper endoscopy based on longstanding epigastric discomfort ameliorated in part by PPI therapy.    Plan:     Risks and benefits of endoscopy reviewed.    This note is partially prepared by Ledell Noss, CMA acting as a scribe in the presence of Dr. Hervey Ard, MD.    The documentation recorded by the scribe accurately reflects the service I personally performed and the decisions made by me.    Robert Bellow, MD FACS

## 2021-02-06 ENCOUNTER — Other Ambulatory Visit (INDEPENDENT_AMBULATORY_CARE_PROVIDER_SITE_OTHER): Payer: Self-pay | Admitting: Vascular Surgery

## 2021-02-06 DIAGNOSIS — I722 Aneurysm of renal artery: Secondary | ICD-10-CM

## 2021-02-07 ENCOUNTER — Ambulatory Visit (INDEPENDENT_AMBULATORY_CARE_PROVIDER_SITE_OTHER): Payer: Medicare HMO

## 2021-02-07 ENCOUNTER — Other Ambulatory Visit: Payer: Self-pay

## 2021-02-07 ENCOUNTER — Ambulatory Visit (INDEPENDENT_AMBULATORY_CARE_PROVIDER_SITE_OTHER): Payer: Medicare HMO | Admitting: Nurse Practitioner

## 2021-02-07 ENCOUNTER — Encounter (INDEPENDENT_AMBULATORY_CARE_PROVIDER_SITE_OTHER): Payer: Self-pay | Admitting: Nurse Practitioner

## 2021-02-07 VITALS — BP 139/83 | HR 62 | Ht 69.0 in | Wt 197.0 lb

## 2021-02-07 DIAGNOSIS — I722 Aneurysm of renal artery: Secondary | ICD-10-CM

## 2021-02-07 DIAGNOSIS — I10 Essential (primary) hypertension: Secondary | ICD-10-CM

## 2021-02-07 NOTE — Progress Notes (Signed)
Subjective:    Patient ID: Bryan Simpson, male    DOB: March 24, 1956, 65 y.o.   MRN: [id number] Chief Complaint  Patient presents with   New Patient (Initial Visit)    Np renal and consult patient notes 1.7 cm right renal artery aneurysm on CT 08/2019. Asymptomatic. Assessment, recommendations for FU requested by byrnett jeffery     Bryan Simpson is a 65 year old male that presents today for follow-up evaluation of a renal artery aneurysm.  This was initially found by CT scan in February 2021.  Today the patient has good blood pressure control.  Overall the aneurysm has been asymptomatic.  Today the patient had a follow-up ultrasound to evaluate for possible renal artery aneurysm however none was visualized.  It did note that there was normal size kidneys bilaterally.  No evidence of renal artery stenosis bilaterally.  Normal cortical thickness was also seen bilaterally.  There was a 3.5 cm simple cyst noted in the right upper pole however this is also consistent with the previous CT results as well.   Review of Systems  All other systems reviewed and are negative.     Objective:   Physical Exam Vitals reviewed.  HENT:     Head: Normocephalic.  Cardiovascular:     Rate and Rhythm: Normal rate.  Pulmonary:     Effort: Pulmonary effort is normal.  Neurological:     Mental Status: He is alert and oriented to person, place, and time.  Psychiatric:        Mood and Affect: Mood normal.        Behavior: Behavior normal.        Thought Content: Thought content normal.        Judgment: Judgment normal.    BP 139/83   Pulse 62   Ht '5\' 9"'$  (1.753 m)   Wt 197 lb (89.4 kg)   BMI 29.09 kg/m   Past Medical History:  Diagnosis Date   Colon polyps 2009   History of kidney stones    Hypertension     Social History   Socioeconomic History   Marital status: Married    Spouse name: Not on file   Number of children: Not on file   Years of education: Not on file   Highest education  level: Not on file  Occupational History   Not on file  Tobacco Use   Smoking status: Never   Smokeless tobacco: Never  Substance and Sexual Activity   Alcohol use: Yes    Alcohol/week: 1.0 standard drink    Types: 1 Glasses of wine per week   Drug use: No   Sexual activity: Not on file  Other Topics Concern   Not on file  Social History Narrative   Not on file   Social Determinants of Health   Financial Resource Strain: Not on file  Food Insecurity: Not on file  Transportation Needs: Not on file  Physical Activity: Not on file  Stress: Not on file  Social Connections: Not on file  Intimate Partner Violence: Not on file    Past Surgical History:  Procedure Laterality Date   COLONOSCOPY  2009   TONSILLECTOMY  age 56    Family History  Problem Relation Age of Onset   Heart Problems Father 26       Heart failure    No Known Allergies  CBC Latest Ref Rng & Units 08/17/2019  WBC 4.0 - 10.5 K/uL 4.3  Hemoglobin 13.0 - 17.0 g/dL 15.0  Hematocrit 39.0 - 52.0 % 44.2  Platelets 150 - 400 K/uL 227      CMP     Component Value Date/Time   NA 142 07/13/2020 1444   K 4.7 07/13/2020 1444   CL 103 07/13/2020 1444   CO2 24 07/13/2020 1444   GLUCOSE 111 (H) 07/13/2020 1444   GLUCOSE 109 (H) 08/17/2019 1416   BUN 13 07/13/2020 1444   CREATININE 1.38 (H) 07/13/2020 1444   CALCIUM 10.0 07/13/2020 1444   PROT 6.9 08/17/2019 1416   ALBUMIN 4.1 08/17/2019 1416   AST 21 08/17/2019 1416   ALT 18 08/17/2019 1416   ALKPHOS 41 08/17/2019 1416   BILITOT 0.7 08/17/2019 1416   GFRNONAA 53 (L) 07/13/2020 1444   GFRAA 61 07/13/2020 1444     No results found.     Assessment & Plan:   1. Renal artery aneurysm (HCC) Today duplex did not reveal any evidence of a renal artery aneurysm.  However that is not necessarily mean that the aneurysm is not there resolved.  In many instances renal artery aneurysms can be difficult to view via ultrasound.  I discussed the renal artery  aneurysm side with patient 1.7 cm is at a relatively low risk of rupture.  The patient also has good blood pressure control, making this aneurysm asymptomatic.  Typically we do not plan to intervene until about 2.5 cm.  It has been approximately 1-1/2 years since this was actually visualized.  In order for better risk assessment we will obtain a CT scan.  The previous CT scan also notes renal cysts which were seen today.  Following the CT scan we will follow-up with the patient for neck steps.  If the aneurysm remains around the same size of 1.7 cm we will plan on annual follow-up.  However if it has shown either substantial growth or reached the 2.5 cm mark we may discuss intervention. - CT Angio Abd/Pel w/ and/or w/o; Future - Creatinine, IStat; Future  2. Essential hypertension Continue antihypertensive medications as already ordered, these medications have been reviewed and there are no changes at this time.    Current Outpatient Medications on File Prior to Visit  Medication Sig Dispense Refill   bisacodyl (DULCOLAX) 5 MG EC tablet Take two tablets morning and two tablets afternoon day prior to Miralax prep.     lisinopril (ZESTRIL) 5 MG tablet Take 5 mg by mouth daily.     pantoprazole (PROTONIX) 40 MG tablet Take 40 mg by mouth daily.     polyethylene glycol powder (GLYCOLAX/MIRALAX) 17 GM/SCOOP powder One bottle for colonoscopy prep. Use as directed.     rosuvastatin (CRESTOR) 10 MG tablet SMARTSIG:1 Tablet(s) By Mouth Every Evening     No current facility-administered medications on file prior to visit.    There are no Patient Instructions on file for this visit. No follow-ups on file.   Kris Hartmann, NP

## 2021-02-14 ENCOUNTER — Encounter: Payer: Self-pay | Admitting: General Surgery

## 2021-02-15 ENCOUNTER — Ambulatory Visit: Payer: Medicare HMO | Admitting: Internal Medicine

## 2021-02-15 ENCOUNTER — Encounter: Payer: Self-pay | Admitting: General Surgery

## 2021-02-15 ENCOUNTER — Ambulatory Visit: Payer: Medicare HMO | Admitting: Anesthesiology

## 2021-02-15 ENCOUNTER — Ambulatory Visit
Admission: RE | Admit: 2021-02-15 | Discharge: 2021-02-15 | Disposition: A | Discharge status: Home / Self Care | Payer: Medicare HMO | Attending: General Surgery | Admitting: General Surgery

## 2021-02-15 ENCOUNTER — Encounter
Admission: RE | Disposition: A | Discharge status: Home / Self Care | Payer: Self-pay | Source: Home / Self Care | Attending: General Surgery

## 2021-02-15 DIAGNOSIS — K5730 Diverticulosis of large intestine without perforation or abscess without bleeding: Secondary | ICD-10-CM | POA: Diagnosis not present

## 2021-02-15 DIAGNOSIS — K635 Polyp of colon: Secondary | ICD-10-CM | POA: Diagnosis not present

## 2021-02-15 DIAGNOSIS — Z86010 Personal history of colonic polyps: Secondary | ICD-10-CM | POA: Diagnosis not present

## 2021-02-15 DIAGNOSIS — K317 Polyp of stomach and duodenum: Secondary | ICD-10-CM | POA: Insufficient documentation

## 2021-02-15 DIAGNOSIS — K2100 Gastro-esophageal reflux disease with esophagitis, without bleeding: Secondary | ICD-10-CM | POA: Insufficient documentation

## 2021-02-15 DIAGNOSIS — D120 Benign neoplasm of cecum: Secondary | ICD-10-CM | POA: Insufficient documentation

## 2021-02-15 DIAGNOSIS — K5790 Diverticulosis of intestine, part unspecified, without perforation or abscess without bleeding: Secondary | ICD-10-CM | POA: Diagnosis not present

## 2021-02-15 DIAGNOSIS — R1013 Epigastric pain: Secondary | ICD-10-CM | POA: Insufficient documentation

## 2021-02-15 DIAGNOSIS — K2270 Barrett's esophagus without dysplasia: Secondary | ICD-10-CM | POA: Insufficient documentation

## 2021-02-15 DIAGNOSIS — Z79899 Other long term (current) drug therapy: Secondary | ICD-10-CM | POA: Insufficient documentation

## 2021-02-15 DIAGNOSIS — Z1211 Encounter for screening for malignant neoplasm of colon: Secondary | ICD-10-CM | POA: Insufficient documentation

## 2021-02-15 HISTORY — PX: ESOPHAGOGASTRODUODENOSCOPY (EGD) WITH PROPOFOL: SHX5813

## 2021-02-15 HISTORY — PX: COLONOSCOPY WITH PROPOFOL: SHX5780

## 2021-02-15 SURGERY — ESOPHAGOGASTRODUODENOSCOPY (EGD) WITH PROPOFOL
Anesthesia: General

## 2021-02-15 MED ORDER — SODIUM CHLORIDE 0.9 % IV SOLN: INTRAVENOUS | Status: DC

## 2021-02-15 MED ORDER — LIDOCAINE HCL (CARDIAC) PF 100 MG/5ML IV SOSY
PREFILLED_SYRINGE | INTRAVENOUS | Status: DC | PRN
  Administered 2021-02-15: 100 mg via INTRAVENOUS

## 2021-02-15 MED ORDER — PROPOFOL 10 MG/ML IV BOLUS
INTRAVENOUS | Status: DC | PRN
  Administered 2021-02-15: 70 mg via INTRAVENOUS
  Administered 2021-02-15: 30 mg via INTRAVENOUS

## 2021-02-15 MED ORDER — PROPOFOL 500 MG/50ML IV EMUL
INTRAVENOUS | Status: DC | PRN
  Administered 2021-02-15: 160 ug/kg/min via INTRAVENOUS

## 2021-02-15 NOTE — H&P (Signed)
DATWAN MUSSELWHITE [id number] 1956-01-08     HPI:  Healthy 65 y/o male with a history of a colonic polyp in 2009, normal exam in 2014. For surveillance.  History of epigastric/ LUQ discomfort partially relieved with PPI. For screening EGD.  Medications Prior to Admission  Medication Sig Dispense Refill Last Dose   lisinopril (ZESTRIL) 5 MG tablet Take 5 mg by mouth daily.   02/14/2021   pantoprazole (PROTONIX) 40 MG tablet Take 40 mg by mouth daily.   02/14/2021   rosuvastatin (CRESTOR) 10 MG tablet SMARTSIG:1 Tablet(s) By Mouth Every Evening   02/14/2021   bisacodyl (DULCOLAX) 5 MG EC tablet Take two tablets morning and two tablets afternoon day prior to Miralax prep.      polyethylene glycol powder (GLYCOLAX/MIRALAX) 17 GM/SCOOP powder One bottle for colonoscopy prep. Use as directed.      No Known Allergies Past Medical History:  Diagnosis Date   Colon polyps 2009   History of kidney stones    Hypertension    Past Surgical History:  Procedure Laterality Date   COLONOSCOPY  2009   TONSILLECTOMY  age 89   Social History   Socioeconomic History   Marital status: Married    Spouse name: Not on file   Number of children: Not on file   Years of education: Not on file   Highest education level: Not on file  Occupational History   Not on file  Tobacco Use   Smoking status: Never   Smokeless tobacco: Never  Substance and Sexual Activity   Alcohol use: Yes    Alcohol/week: 1.0 standard drink    Types: 1 Glasses of wine per week   Drug use: No   Sexual activity: Not on file  Other Topics Concern   Not on file  Social History Narrative   Not on file   Social Determinants of Health   Financial Resource Strain: Not on file  Food Insecurity: Not on file  Transportation Needs: Not on file  Physical Activity: Not on file  Stress: Not on file  Social Connections: Not on file  Intimate Partner Violence: Not on file   Social History   Social History Narrative   Not on file      ROS: Negative.     PE: HEENT: Negative. Lungs: Clear. Cardio: RR.   Assessment/Plan:  Proceed with planned upper and lower endoscopy.   Forest Gleason [facility] 02/15/2021

## 2021-02-15 NOTE — Anesthesia Procedure Notes (Signed)
Date/Time: 02/15/2021 8:32 AM Performed by: Demetrius Charity, CRNA Oxygen Delivery Method: Nasal cannula Induction Type: IV induction Placement Confirmation: positive ETCO2 and CO2 detector

## 2021-02-15 NOTE — Op Note (Signed)
[facility] Patient Name: Bryan Simpson Procedure Date: 02/15/2021 8:23 AM MRN: [id number] Account #: [id number] Date of Birth: [birth year] Admit Type: Outpatient Age: 65 Room: [facility] ENDO ROOM 1 Gender: Male Note Status: Finalized Procedure:             Colonoscopy Indications:           High risk colon cancer surveillance: Personal history                         of colonic polyps Providers:             Robert Bellow, MD Referring MD:          Leona Carry. Hall Busing, MD (Referring MD) Medicines:             Propofol per Anesthesia Complications:         No immediate complications. Procedure:             Pre-Anesthesia Assessment:                        - Prior to the procedure, a History and Physical was                         performed, and patient medications, allergies and                         sensitivities were reviewed. The patient's tolerance                         of previous anesthesia was reviewed.                        - The risks and benefits of the procedure and the                         sedation options and risks were discussed with the                         patient. All questions were answered and informed                         consent was obtained.                        After obtaining informed consent, the colonoscope was                         passed under direct vision. Throughout the procedure,                         the patient's blood pressure, pulse, and oxygen                         saturations were monitored continuously. The                         Colonoscope was introduced through the anus and                         advanced to the the terminal  ileum. The colonoscopy                         was somewhat difficult due to significant looping.                         Successful completion of the procedure was aided by                         using manual pressure. The patient tolerated the                          procedure well. The quality of the bowel preparation                         was excellent. Findings:      A 5 mm polyp was found in the cecum. The polyp was sessile. Biopsies       were taken with a cold forceps for histology.      A few small-mouthed diverticula were found in the sigmoid colon and       descending colon.      The retroflexed view of the distal rectum and anal verge was normal and       showed no anal or rectal abnormalities. Impression:            - One 5 mm polyp in the cecum. Biopsied.                        - Diverticulosis in the sigmoid colon and in the                         descending colon.                        - The distal rectum and anal verge are normal on                         retroflexion view. Recommendation:        - Telephone endoscopist for pathology results in 1                         week. Procedure Code(s):     --- Professional ---                        [phone], Colonoscopy, flexible; with biopsy, single or                         multiple Diagnosis Code(s):     --- Professional ---                        Z86.010, Personal history of colonic polyps                        K63.5, Polyp of colon                        K57.30, Diverticulosis of large intestine without  perforation or abscess without bleeding CPT copyright 2019 American Medical Association. All rights reserved. The codes documented in this report are preliminary and upon coder review may  be revised to meet current compliance requirements. Robert Bellow, MD 02/15/2021 9:06:11 AM This report has been signed electronically. Number of Addenda: 0 Note Initiated On: 02/15/2021 8:23 AM Scope Withdrawal Time: 0 hours 10 minutes 47 seconds  Total Procedure Duration: 0 hours 15 minutes 13 seconds  Estimated Blood Loss:  Estimated blood loss: none.      [facility]

## 2021-02-15 NOTE — Anesthesia Postprocedure Evaluation (Signed)
Anesthesia Post Note  Patient: AVRUMI CORELL  Procedure(s) Performed: ESOPHAGOGASTRODUODENOSCOPY (EGD) WITH PROPOFOL COLONOSCOPY WITH PROPOFOL  Patient location during evaluation: Endoscopy Anesthesia Type: General Level of consciousness: awake and alert and oriented Pain management: pain level controlled Vital Signs Assessment: post-procedure vital signs reviewed and stable Respiratory status: spontaneous breathing, nonlabored ventilation and respiratory function stable Cardiovascular status: blood pressure returned to baseline and stable Postop Assessment: no signs of nausea or vomiting Anesthetic complications: no   No notable events documented.   Last Vitals:  Vitals:   02/15/21 0920 02/15/21 0930  BP: 118/78 121/79  Pulse: 64 (!) 55  Resp: 12 10  Temp:    SpO2: 98% 100%    Last Pain:  Vitals:   02/15/21 0757  TempSrc: Temporal  PainSc: 0-No pain                 Calob Baskette

## 2021-02-15 NOTE — Anesthesia Preprocedure Evaluation (Signed)
Anesthesia Evaluation  Patient identified by MRN, date of birth, ID band Patient awake    Reviewed: Allergy & Precautions, NPO status , Patient's Chart, lab work & pertinent test results  History of Anesthesia Complications Negative for: history of anesthetic complications  Airway Mallampati: II  TM Distance: >3 FB Neck ROM: Full    Dental  (+) Partial Upper   Pulmonary neg pulmonary ROS, neg sleep apnea, neg COPD,    breath sounds clear to auscultation- rhonchi (-) wheezing      Cardiovascular Exercise Tolerance: Good hypertension, Pt. on medications (-) CAD, (-) Past MI, (-) Cardiac Stents and (-) CABG  Rhythm:Regular Rate:Normal - Systolic murmurs and - Diastolic murmurs    Neuro/Psych neg Seizures negative neurological ROS  negative psych ROS   GI/Hepatic negative GI ROS, Neg liver ROS,   Endo/Other  negative endocrine ROSneg diabetes  Renal/GU negative Renal ROS     Musculoskeletal negative musculoskeletal ROS (+)   Abdominal (+) - obese,   Peds  Hematology negative hematology ROS (+)   Anesthesia Other Findings Past Medical History: 2009: Colon polyps No date: History of kidney stones No date: Hypertension   Reproductive/Obstetrics                             Anesthesia Physical Anesthesia Plan  ASA: 2  Anesthesia Plan: General   Post-op Pain Management:    Induction: Intravenous  PONV Risk Score and Plan: 1 and Propofol infusion  Airway Management Planned: Natural Airway  Additional Equipment:   Intra-op Plan:   Post-operative Plan:   Informed Consent: I have reviewed the patients History and Physical, chart, labs and discussed the procedure including the risks, benefits and alternatives for the proposed anesthesia with the patient or authorized representative who has indicated his/her understanding and acceptance.     Dental advisory given  Plan Discussed  with: CRNA and Anesthesiologist  Anesthesia Plan Comments:         Anesthesia Quick Evaluation

## 2021-02-15 NOTE — Op Note (Signed)
[facility] Patient Name: Bryan Simpson Procedure Date: 02/15/2021 8:23 AM MRN: [id number] Account #: [id number] Date of Birth: 1956/02/09 Admit Type: Outpatient Age: 65 Room: [facility] ENDO ROOM 1 Gender: Male Note Status: Finalized Procedure:             Upper GI endoscopy Indications:           Epigastric abdominal pain Providers:             Robert Bellow, MD Referring MD:          Leona Carry. Hall Busing, MD (Referring MD) Medicines:             Propofol per Anesthesia Complications:         No immediate complications. Procedure:             Pre-Anesthesia Assessment:                        - Prior to the procedure, a History and Physical was                         performed, and patient medications, allergies and                         sensitivities were reviewed. The patient's tolerance                         of previous anesthesia was reviewed.                        - The risks and benefits of the procedure and the                         sedation options and risks were discussed with the                         patient. All questions were answered and informed                         consent was obtained.                        After obtaining informed consent, the endoscope was                         passed under direct vision. Throughout the procedure,                         the patient's blood pressure, pulse, and oxygen                         saturations were monitored continuously. The Endoscope                         was introduced through the mouth, and advanced to the                         second part of duodenum. The upper GI endoscopy was  accomplished without difficulty. The patient tolerated                         the procedure well. Findings:      LA Grade A (one or more mucosal breaks less than 5 mm, not extending       between tops of 2 mucosal folds) esophagitis with no bleeding was found        39 cm from the incisors. Biopsies were taken with a cold forceps for       histology.      Multiple 3 to 6 mm sessile polyps with no bleeding and no stigmata of       recent bleeding were found in the gastric body. Biopsies were taken with       a cold forceps for histology.      The examined duodenum was normal. Impression:            - LA Grade A reflux esophagitis with no bleeding.                         Biopsied.                        - Multiple gastric polyps. Biopsied.                        - Normal examined duodenum. Recommendation:        - Telephone endoscopist for pathology results in 1                         week. Procedure Code(s):     --- Professional ---                        [phone], Esophagogastroduodenoscopy, flexible,                         transoral; with biopsy, single or multiple Diagnosis Code(s):     --- Professional ---                        K21.00, Gastro-esophageal reflux disease with                         esophagitis, without bleeding                        K31.7, Polyp of stomach and duodenum                        R10.13, Epigastric pain CPT copyright 2019 [employer]. All rights reserved. The codes documented in this report are preliminary and upon coder review may  be revised to meet current compliance requirements. Robert Bellow, MD 02/15/2021 8:47:04 AM This report has been signed electronically. Number of Addenda: 0 Note Initiated On: 02/15/2021 8:23 AM Estimated Blood Loss:  Estimated blood loss was minimal.      [facility]

## 2021-02-15 NOTE — Transfer of Care (Signed)
Immediate Anesthesia Transfer of Care Note  Patient: Bryan Simpson  Procedure(s) Performed: ESOPHAGOGASTRODUODENOSCOPY (EGD) WITH PROPOFOL COLONOSCOPY WITH PROPOFOL  Patient Location: PACU  Anesthesia Type:General  Level of Consciousness: drowsy  Airway & Oxygen Therapy: Patient Spontanous Breathing and Patient connected to nasal cannula oxygen  Post-op Assessment: Report given to RN and Post -op Vital signs reviewed and stable  Post vital signs: Reviewed and stable  Last Vitals:  Vitals Value Taken Time  BP 94/67 02/15/21 0910  Temp    Pulse 64 02/15/21 0915  Resp 10 02/15/21 0915  SpO2 98 % 02/15/21 0915  Vitals shown include unvalidated device data.  Last Pain:  Vitals:   02/15/21 0757  TempSrc: Temporal  PainSc: 0-No pain         Complications: No notable events documented.

## 2021-02-16 ENCOUNTER — Encounter: Payer: Self-pay | Admitting: General Surgery

## 2021-02-16 LAB — SURGICAL PATHOLOGY

## 2021-02-27 DIAGNOSIS — K635 Polyp of colon: Secondary | ICD-10-CM | POA: Insufficient documentation

## 2021-03-09 ENCOUNTER — Encounter: Payer: Self-pay | Admitting: Nurse Practitioner

## 2021-03-09 ENCOUNTER — Ambulatory Visit: Payer: Medicare HMO | Admitting: Nurse Practitioner

## 2021-03-09 ENCOUNTER — Other Ambulatory Visit: Payer: Self-pay

## 2021-03-09 VITALS — BP 136/88 | HR 67 | Ht 69.0 in | Wt 194.5 lb

## 2021-03-09 DIAGNOSIS — E782 Mixed hyperlipidemia: Secondary | ICD-10-CM

## 2021-03-09 DIAGNOSIS — K2100 Gastro-esophageal reflux disease with esophagitis, without bleeding: Secondary | ICD-10-CM | POA: Diagnosis not present

## 2021-03-09 DIAGNOSIS — I2510 Atherosclerotic heart disease of native coronary artery without angina pectoris: Secondary | ICD-10-CM | POA: Diagnosis not present

## 2021-03-09 DIAGNOSIS — I10 Essential (primary) hypertension: Secondary | ICD-10-CM

## 2021-03-09 DIAGNOSIS — R079 Chest pain, unspecified: Secondary | ICD-10-CM

## 2021-03-09 NOTE — Patient Instructions (Addendum)
Medication Instructions:  - Your physician recommends that you continue on your current medications as directed. Please refer to the Current Medication list given to you today.  *If you need a refill on your cardiac medications before your next appointment, please call your pharmacy*   Lab Work: - none ordered  If you have labs (blood work) drawn today and your tests are completely normal, you will receive your results only by: [place] (if you have MyChart) OR A paper copy in the mail If you have any lab test that is abnormal or we need to change your treatment, we will call you to review the results.   Testing/Procedures: - none ordered   Follow-Up: At [facility], you and your health needs are our priority.  As part of our continuing mission to provide you with exceptional heart care, we have created designated Provider Care Teams.  These Care Teams include your primary Cardiologist (physician) and Advanced Practice Providers (APPs -  Physician Assistants and Nurse Practitioners) who all work together to provide you with the care you need, when you need it.  We recommend signing up for the patient portal called "MyChart".  Sign up information is provided on this After Visit Summary.  MyChart is used to connect with patients for Virtual Visits (Telemedicine).  Patients are able to view lab/test results, encounter notes, upcoming appointments, etc.  Non-urgent messages can be sent to your provider as well.   To learn more about what you can do with MyChart, go to [email or web address].    Your next appointment:   1 year(s)  A reminder letter to call and schedule your appointment will be mailed to you closer to that time.   The format for your next appointment:   In Person  Provider:   You may see Nelva Bush, MD or one of the following Advanced Practice Providers on your designated Care Team:   Murray Hodgkins, NP    Other Instructions N/a

## 2021-03-09 NOTE — Progress Notes (Signed)
Office Visit    Patient Name: Bryan Simpson Date of Encounter: 03/09/2021  Primary Care Provider:  Albina Billet, MD Primary Cardiologist:  Nelva Bush, MD  Chief Complaint    65 year old male with a history of noncardiac chest pain, nonobstructive CAD, hypertension, and nephrolithiasis, who presents for follow-up related to chest pain.  Past Medical History    Past Medical History:  Diagnosis Date   Colon polyps 2009   Hypertension    Mixed hyperlipidemia    Nephrolithiasis    Non-obstructive CAD (coronary artery disease)    a. 08/04/2020 Coronary CTA: LM nl.  LAD proximal and mid calcification with less than 25% stenosis.  Nondominant LCx without plaque.  Dominant RCA with minimal calcification and less than 25% stenosis.  CAC score 253 (69th percentile).   Precordial chest pain ([facility] vs GI)    Past Surgical History:  Procedure Laterality Date   COLONOSCOPY  2009   COLONOSCOPY WITH PROPOFOL N/A 02/15/2021   Procedure: COLONOSCOPY WITH PROPOFOL;  Surgeon: Robert Bellow, MD;  Location: [facility] ENDOSCOPY;  Service: Endoscopy;  Laterality: N/A;   ESOPHAGOGASTRODUODENOSCOPY (EGD) WITH PROPOFOL N/A 02/15/2021   Procedure: ESOPHAGOGASTRODUODENOSCOPY (EGD) WITH PROPOFOL;  Surgeon: Robert Bellow, MD;  Location: [facility] ENDOSCOPY;  Service: Endoscopy;  Laterality: N/A;   TONSILLECTOMY  age 22    Allergies  No Known Allergies  History of Present Illness    65 year old male with above past medical history including noncardiac chest pain, nonobstructive CAD, hypertension, and nephrolithiasis.  He was previously evaluated in January of this year with a 3-year history of intermittent chest pain under the left breast, often occurring after eating or drinking (especially alcohol).  Pain seem to worsen late 2021 despite ongoing PPI therapy and was worse with certain upper body movements.  He underwent coronary CTA on January 27 which showed minimal nonobstructive plaque in the  coronary calcium score of 253 (69th percentile).  Lifestyle modifications were recommended and statin therapy was subsequently added by his primary care provider.  Since his last visit, patient says he has had improvement in left lower chest discomfort.  He he has been taking his Protonix more regularly and thinks that this is made a difference.  It still does occur and does seem to be more likely to be exacerbated after eating, or drinking.  He does not think alcohol versus water makes a big difference.  He underwent EGD and colonoscopy earlier this month.  EGD showed reflux esophagitis and gastric polyps.  Colonoscopy notable for a polyp.  In general, he is pleased with his management and says discomfort is 90% better.  He remains active without dyspnea.  There is no PND, orthopnea, dizziness, syncope, edema, or early satiety.  Home Medications    Current Outpatient Medications  Medication Sig Dispense Refill   lisinopril (ZESTRIL) 5 MG tablet Take 5 mg by mouth daily.     pantoprazole (PROTONIX) 40 MG tablet Take 40 mg by mouth daily.     rosuvastatin (CRESTOR) 10 MG tablet SMARTSIG:1 Tablet(s) By Mouth Every Evening     sildenafil (VIAGRA) 100 MG tablet Take 1 tablet by mouth See admin instructions.     No current facility-administered medications for this visit.     Review of Systems    He continues to have intermittent left lower chest discomfort, specifically related to meals or drinking something.  He denies palpitations, dyspnea, pnd, orthopnea, n, v, dizziness, syncope, edema, weight gain, or early satiety.  All other  systems reviewed and are otherwise negative except as noted above.  Physical Exam    VS:  BP 136/88 (BP Location: Left Arm, Patient Position: Sitting, Cuff Size: Normal)   Pulse 67   Ht '5\' 9"'$  (1.753 m)   Wt 194 lb 8 oz (88.2 kg)   SpO2 99%   BMI 28.72 kg/m  , BMI Body mass index is 28.72 kg/m.     GEN: Well nourished, well developed, in no acute  distress. HEENT: normal. Neck: Supple, no JVD, carotid bruits, or masses. Cardiac: RRR, no murmurs, rubs, or gallops. No clubbing, cyanosis, edema.  PT 2+ and equal bilaterally.  Respiratory:  Respirations regular and unlabored, clear to auscultation bilaterally. GI: Soft, nontender, nondistended, BS + x 4. MS: no deformity or atrophy. Skin: warm and dry, no rash. Neuro:  Strength and sensation are intact. Psych: Normal affect.  Accessory Clinical Findings    ECG personally reviewed by me today - Regular sinus rhythm, 67, first-degree AV block, left axis deviation  - no acute changes.  Lab Results  Component Value Date   WBC 4.3 08/17/2019   HGB 15.0 08/17/2019   HCT 44.2 08/17/2019   MCV 96.1 08/17/2019   PLT 227 08/17/2019   Lab Results  Component Value Date   CREATININE 1.38 (H) 07/13/2020   BUN 13 07/13/2020   NA 142 07/13/2020   K 4.7 07/13/2020   CL 103 07/13/2020   CO2 24 07/13/2020   Lab Results  Component Value Date   ALT 18 08/17/2019   AST 21 08/17/2019   ALKPHOS 41 08/17/2019   BILITOT 0.7 08/17/2019    Assessment & Plan    1.  Noncardiac left lower chest pain/nonobstructive CAD: Patient previously evaluated for left lower chest pain that was occurring after meals and with some position changes.  Coronary CTA without any significant disease and conservative therapy recommended.  Today, he notes improvement in discomfort while taking daily PPI therapy.  He still occasionally has symptoms but overall is reassured by our prior negative findings.  Continue statin therapy for secondary prevention.  2.  Essential hypertension: He is taking lisinopril 5 mg daily.  Blood pressure mildly elevated today 136/88.  He does check her blood pressure at home and says it typically runs in the 120s over 70s to 80.  He will continue to follow blood pressure at home.  Ongoing lifestyle modification and regular activity advised.  3.  Hyperlipidemia: Since his last visit, patient  placed on statin therapy by his primary care provider.  No labs available for review.  4.  GERD/reflux esophagitis: Status post recent EGD.  Symptoms overall stable on PPI.  5.  Disposition: Follow-up in 1 year or sooner if necessary.   Murray Hodgkins, NP 03/09/2021, 8:35 AM

## 2021-08-17 ENCOUNTER — Ambulatory Visit
Admission: RE | Admit: 2021-08-17 | Discharge: 2021-08-17 | Disposition: A | Discharge status: Home / Self Care | Payer: Medicare HMO | Source: Ambulatory Visit | Attending: Nurse Practitioner | Admitting: Nurse Practitioner

## 2021-08-17 ENCOUNTER — Other Ambulatory Visit: Payer: Self-pay

## 2021-08-17 DIAGNOSIS — I722 Aneurysm of renal artery: Secondary | ICD-10-CM | POA: Insufficient documentation

## 2021-08-17 LAB — POCT I-STAT CREATININE: Creatinine, Ser: 1.2 mg/dL (ref 0.61–1.24)

## 2021-08-17 MED ORDER — IOHEXOL 350 MG/ML SOLN
100.0000 mL | Freq: Once | INTRAVENOUS | Status: AC | PRN
Start: 2021-08-17 — End: 2021-08-17
  Administered 2021-08-17: 100 mL via INTRAVENOUS

## 2021-08-22 ENCOUNTER — Other Ambulatory Visit: Payer: Self-pay

## 2021-08-22 ENCOUNTER — Ambulatory Visit (INDEPENDENT_AMBULATORY_CARE_PROVIDER_SITE_OTHER): Payer: Medicare HMO | Admitting: Vascular Surgery

## 2021-08-22 ENCOUNTER — Encounter (INDEPENDENT_AMBULATORY_CARE_PROVIDER_SITE_OTHER): Payer: Self-pay | Admitting: Vascular Surgery

## 2021-08-22 VITALS — BP 126/83 | HR 83 | Resp 16 | Wt 189.0 lb

## 2021-08-22 DIAGNOSIS — I722 Aneurysm of renal artery: Secondary | ICD-10-CM | POA: Insufficient documentation

## 2021-08-22 DIAGNOSIS — E785 Hyperlipidemia, unspecified: Secondary | ICD-10-CM | POA: Diagnosis not present

## 2021-08-22 DIAGNOSIS — I10 Essential (primary) hypertension: Secondary | ICD-10-CM

## 2021-08-22 NOTE — Progress Notes (Signed)
MRN : [id number]  Bryan Simpson is a 66 y.o. (Aug 27, 1955) male who presents with chief complaint of  Chief Complaint  Patient presents with   Follow-up    Ct results  .  History of Present Illness: Patient returns today in follow up of his renal artery aneurysm.  Since his last visit, he has undergone a CT angiogram of the abdomen and pelvis which I have independently reviewed.  This demonstrates a 2.5 cm right renal artery aneurysm at the branches of the right main renal artery feeding what appeared to be 3 or 4 different renal artery branches.  There is no left renal artery aneurysm and the left renal artery appears relatively normal.  There is a fairly long main right renal artery before the aneurysm.  Current Outpatient Medications  Medication Sig Dispense Refill   lisinopril (ZESTRIL) 5 MG tablet Take 5 mg by mouth daily.     pantoprazole (PROTONIX) 40 MG tablet Take 40 mg by mouth daily.     rosuvastatin (CRESTOR) 10 MG tablet SMARTSIG:1 Tablet(s) By Mouth Every Evening     sildenafil (VIAGRA) 100 MG tablet Take 1 tablet by mouth See admin instructions.     No current facility-administered medications for this visit.    Past Medical History:  Diagnosis Date   Colon polyps 2009   Hypertension    Mixed hyperlipidemia    Nephrolithiasis    Non-obstructive CAD (coronary artery disease)    a. 08/04/2020 Coronary CTA: LM nl.  LAD proximal and mid calcification with less than 25% stenosis.  Nondominant LCx without plaque.  Dominant RCA with minimal calcification and less than 25% stenosis.  CAC score 253 (69th percentile).   Precordial chest pain ([facility] vs [facility])     Past Surgical History:  Procedure Laterality Date   COLONOSCOPY  2009   COLONOSCOPY WITH PROPOFOL N/A 02/15/2021   Procedure: COLONOSCOPY WITH PROPOFOL;  Surgeon: Robert Bellow, MD;  Location: [facility];  Service: Endoscopy;  Laterality: N/A;   ESOPHAGOGASTRODUODENOSCOPY (EGD) WITH PROPOFOL N/A 02/15/2021    Procedure: ESOPHAGOGASTRODUODENOSCOPY (EGD) WITH PROPOFOL;  Surgeon: Robert Bellow, MD;  Location: [facility]COPY;  Service: Endoscopy;  Laterality: N/A;   TONSILLECTOMY  age 69     Social History   Tobacco Use   Smoking status: Never   Smokeless tobacco: Never  Vaping Use   Vaping Use: Never used  Substance Use Topics   Alcohol use: Yes    Alcohol/week: 1.0 standard drink    Types: 1 Glasses of wine per week   Drug use: No      Family History  Problem Relation Age of Onset   Heart Problems Father 55       Heart failure  No bleeding or clotting disorders No aneurysm  No Known Allergies   REVIEW OF SYSTEMS (Negative unless checked)  Constitutional: [] Weight loss  [] Fever  [] Chills Cardiac: [x] Chest pain   [] Chest pressure   [] Palpitations   [] Shortness of breath when laying flat   [] Shortness of breath at rest   [] Shortness of breath with exertion. Vascular:  [] Pain in legs with walking   [] Pain in legs at rest   [] Pain in legs when laying flat   [] Claudication   [] Pain in feet when walking  [] Pain in feet at rest  [] Pain in feet when laying flat   [] History of DVT   [] Phlebitis   [] Swelling in legs   [] Varicose veins   [] Non-healing ulcers Pulmonary:   [] Uses home  oxygen   [] Productive cough   [] Hemoptysis   [] Wheeze  [] COPD   [] Asthma Neurologic:  [] Dizziness  [] Blackouts   [] Seizures   [] History of stroke   [] History of TIA  [] Aphasia   [] Temporary blindness   [] Dysphagia   [] Weakness or numbness in arms   [] Weakness or numbness in legs Musculoskeletal:  [] Arthritis   [] Joint swelling   [] Joint pain   [] Low back pain Hematologic:  [] Easy bruising  [] Easy bleeding   [] Hypercoagulable state   [] Anemic   Gastrointestinal:  [] Blood in stool   [] Vomiting blood  [] Gastroesophageal reflux/heartburn   [] Abdominal pain Genitourinary:  [] Chronic kidney disease   [] Difficult urination  [] Frequent urination  [] Burning with urination   [] Hematuria Skin:  [] Rashes   [] Ulcers    [] Wounds Psychological:  [] History of anxiety   []  History of major depression.  Physical Examination  BP 126/83 (BP Location: Left Arm)    Pulse 83    Resp 16    Wt 189 lb (85.7 kg)    BMI 27.91 kg/m  Gen:  WD/WN, NAD.  Appears younger than stated age Head: [place]/AT, No temporalis wasting. Ear/Nose/Throat: Hearing grossly intact, nares w/o erythema or drainage Eyes: Conjunctiva clear. Sclera non-icteric Neck: Supple.  Trachea midline Pulmonary:  Good air movement, no use of accessory muscles.  Cardiac: RRR, no JVD Vascular:  Vessel Right Left  Radial Palpable Palpable                                   Gastrointestinal: soft, non-tender/non-distended. No guarding/reflex.  Musculoskeletal: M/S 5/5 throughout.  No deformity or atrophy.  No edema. Neurologic: Sensation grossly intact in extremities.  Symmetrical.  Speech is fluent.  Psychiatric: Judgment intact, Mood & affect appropriate for pt's clinical situation. Dermatologic: No rashes or ulcers noted.  No cellulitis or open wounds.      Labs Recent Results (from the past 2160 hour(s))  I-STAT creatinine     Status: None   Collection Time: 08/17/21  8:28 AM  Result Value Ref Range   Creatinine, Ser 1.20 0.61 - 1.24 mg/dL    Radiology CT Angio Abd/Pel w/ and/or w/o  Result Date: 08/17/2021 CLINICAL DATA:  Renal artery aneurysm follow-up EXAM: CTA ABDOMEN AND PELVIS WITHOUT AND WITH CONTRAST TECHNIQUE: Multidetector CT imaging of the abdomen and pelvis was performed using the standard protocol during bolus administration of intravenous contrast. Multiplanar reconstructed images and MIPs were obtained and reviewed to evaluate the vascular anatomy. RADIATION DOSE REDUCTION: This exam was performed according to the departmental dose-optimization program which includes automated exposure control, adjustment of the mA and/or kV according to patient size and/or use of iterative reconstruction technique. CONTRAST:  163mL OMNIPAQUE  IOHEXOL 350 MG/ML SOLN COMPARISON:  CT abdomen pelvis 08/19/2019 FINDINGS: VASCULAR Aorta: Scattered atheromatous calcifications seen throughout the abdominal aorta without aneurysm or significant stenosis. Celiac: Patent without evidence of aneurysm, dissection, vasculitis or significant stenosis. SMA: Patent without evidence of aneurysm, dissection, vasculitis or significant stenosis. Renals: Single bilateral renal arteries are noted. No significant stenosis or aneurysm of the left main renal artery. Aneurysm originating from the distal right main renal artery measures 2.5 x 1.7 x 1.7 cm. There are 3 outflow branches and 1 inflow branch. It is unchanged in size since prior study. IMA: Patent without evidence of aneurysm, dissection, vasculitis or significant stenosis. Inflow: Patent without evidence of aneurysm, dissection, vasculitis or significant stenosis. Proximal Outflow: Bilateral common  femoral and visualized portions of the superficial and profunda femoral arteries are patent without evidence of aneurysm, dissection, vasculitis or significant stenosis. Veins: No obvious venous abnormality within the limitations of this arterial phase study. Review of the MIP images confirms the above findings. NON-VASCULAR Lower chest: No acute abnormality. Hepatobiliary: 1.5 cm hypodense lesion again seen in the dome of the right hepatic lobe (8/5). This cannot be definitely characterize as a hemangioma or cyst. Further evaluation with MRI should be performed. Liver, bile ducts, and gallbladder otherwise unremarkable. Pancreas: Unremarkable. No pancreatic ductal dilatation or surrounding inflammatory changes. Spleen: Normal in size without focal abnormality. Adrenals/Urinary Tract: Adrenal glands are normal. 6 mm nonobstructing calculus seen in the upper pole of the right kidney. Multiple nonobstructing left renal calculi measuring up to 4 mm. No hydronephrosis. Multiple bilateral renal cysts are present with the largest  on the left measuring 3.0 cm in the largest on the right measuring 3.9 cm. There is a 1.3 cm exophytic lesion extending laterally from the upper pole the right kidney which cannot be characterized as a simple cyst. Nonobstructing calculus again noted within the right distal ureter measuring 8 mm. Ureters and bladder otherwise unremarkable. Stomach/Bowel: No bowel dilatation to indicate ileus or obstruction. Appendix is normal. Lymphatic: No enlarged abdominal or pelvic lymph nodes. Reproductive: Mildly enlarged prostate. Other: No abdominal wall hernia or abnormality. No abdominopelvic ascites. Musculoskeletal: No acute or significant osseous findings. IMPRESSION: VASCULAR Unchanged aneurysm of the distal right main renal artery measuring 2.5 x 1.7 x 1.7 cm. NON-VASCULAR 1. 1.5 cm right hepatic dome lesion cannot be completely characterized as a hemangioma or cyst on the current exam. Further evaluation with MRI is recommended. 2. 1.3 cm exophytic right upper pole renal lesion cannot be characterized as a simple cyst. Further evaluation with MRI is recommended. 3. Nonobstructing bilateral renal calculi. Nonobstructing right distal ureteral calculus unchanged from prior exam. No hydronephrosis. Electronically Signed   By: Miachel Roux M.D.   On: 08/17/2021 15:56    Assessment/Plan  Aneurysm of right renal artery ([place]) he has undergone a CT angiogram of the abdomen and pelvis which I have independently reviewed.  This demonstrates a 2.5 cm right renal artery aneurysm at the branches of the right main renal artery feeding what appeared to be 3 or 4 different renal artery branches.  There is no left renal artery aneurysm and the left renal artery appears relatively normal.  There is a fairly long main right renal artery before the aneurysm. I had a discussion with the patient at some length today describing the pathophysiology of renal artery aneurysms.  At this size in a relatively young healthy patient, it  should be addressed to reduce the risk of lethal rupture.  I have had discussions with him regarding options for treatment.  1 option would be nephrectomy but this would be the most invasive.  I have discussed that endovascular options for treatment are usually available.  The location of his aneurysm is complex and challenging.  We will not be able to save his entire kidney, and I am not sure we will be able to save any of the kidney.  He may require coil embolization of the entire kidney to definitively address the aneurysm.  My hope would be that we can coil embolize some of the branches and save the largest branch of the kidney to preserve as much function as possible.  This would be coil embolization of branches with covered stent placement to the largest  renal artery branch.  I discussed that we may not know this until the time of the procedure.  The patient voices his understanding.  He is going to discuss this with his wife and selected date give [place] a call to get this scheduled.  Essential hypertension blood pressure control important in reducing the progression of atherosclerotic disease. On appropriate oral medications.   Hyperlipidemia lipid control important in reducing the progression of atherosclerotic disease. Continue statin therapy    Leotis Pain, MD  08/22/2021 9:55 AM    This note was created with Dragon medical transcription system.  Any errors from dictation are purely unintentional

## 2021-08-22 NOTE — H&P (View-Only) (Signed)
MRN : [id number]  Bryan Simpson is a 66 y.o. (03/15/1956) male who presents with chief complaint of  Chief Complaint  Patient presents with   Follow-up    Ct results  .  History of Present Illness: Patient returns today in follow up of his renal artery aneurysm.  Since his last visit, he has undergone a CT angiogram of the abdomen and pelvis which I have independently reviewed.  This demonstrates a 2.5 cm right renal artery aneurysm at the branches of the right main renal artery feeding what appeared to be 3 or 4 different renal artery branches.  There is no left renal artery aneurysm and the left renal artery appears relatively normal.  There is a fairly long main right renal artery before the aneurysm.  Current Outpatient Medications  Medication Sig Dispense Refill   lisinopril (ZESTRIL) 5 MG tablet Take 5 mg by mouth daily.     pantoprazole (PROTONIX) 40 MG tablet Take 40 mg by mouth daily.     rosuvastatin (CRESTOR) 10 MG tablet SMARTSIG:1 Tablet(s) By Mouth Every Evening     sildenafil (VIAGRA) 100 MG tablet Take 1 tablet by mouth See admin instructions.     No current facility-administered medications for this visit.    Past Medical History:  Diagnosis Date   Colon polyps 2009   Hypertension    Mixed hyperlipidemia    Nephrolithiasis    Non-obstructive CAD (coronary artery disease)    a. 08/04/2020 Coronary CTA: LM nl.  LAD proximal and mid calcification with less than 25% stenosis.  Nondominant LCx without plaque.  Dominant RCA with minimal calcification and less than 25% stenosis.  CAC score 253 (69th percentile).   Precordial chest pain ([facility])     Past Surgical History:  Procedure Laterality Date   COLONOSCOPY  2009   COLONOSCOPY WITH PROPOFOL N/A 02/15/2021   Procedure: COLONOSCOPY WITH PROPOFOL;  Surgeon: Robert Bellow, MD;  Location: [facility];  Service: Endoscopy;  Laterality: N/A;   ESOPHAGOGASTRODUODENOSCOPY (EGD) WITH PROPOFOL N/A 02/15/2021    Procedure: ESOPHAGOGASTRODUODENOSCOPY (EGD) WITH PROPOFOL;  Surgeon: Robert Bellow, MD;  Location: [facility];  Service: Endoscopy;  Laterality: N/A;   TONSILLECTOMY  age 50     Social History   Tobacco Use   Smoking status: Never   Smokeless tobacco: Never  Vaping Use   Vaping Use: Never used  Substance Use Topics   Alcohol use: Yes    Alcohol/week: 1.0 standard drink    Types: 1 Glasses of wine per week   Drug use: No      Family History  Problem Relation Age of Onset   Heart Problems Father 23       Heart failure  No bleeding or clotting disorders No aneurysm  No Known Allergies   REVIEW OF SYSTEMS (Negative unless checked)  Constitutional: [] Weight loss  [] Fever  [] Chills Cardiac: [x] Chest pain   [] Chest pressure   [] Palpitations   [] Shortness of breath when laying flat   [] Shortness of breath at rest   [] Shortness of breath with exertion. Vascular:  [] Pain in legs with walking   [] Pain in legs at rest   [] Pain in legs when laying flat   [] Claudication   [] Pain in feet when walking  [] Pain in feet at rest  [] Pain in feet when laying flat   [] History of DVT   [] Phlebitis   [] Swelling in legs   [] Varicose veins   [] Non-healing ulcers Pulmonary:   [] Uses home  oxygen   [] Productive cough   [] Hemoptysis   [] Wheeze  [] COPD   [] Asthma Neurologic:  [] Dizziness  [] Blackouts   [] Seizures   [] History of stroke   [] History of TIA  [] Aphasia   [] Temporary blindness   [] Dysphagia   [] Weakness or numbness in arms   [] Weakness or numbness in legs Musculoskeletal:  [] Arthritis   [] Joint swelling   [] Joint pain   [] Low back pain Hematologic:  [] Easy bruising  [] Easy bleeding   [] Hypercoagulable state   [] Anemic   Gastrointestinal:  [] Blood in stool   [] Vomiting blood  [] Gastroesophageal reflux/heartburn   [] Abdominal pain Genitourinary:  [] Chronic kidney disease   [] Difficult urination  [] Frequent urination  [] Burning with urination   [] Hematuria Skin:  [] Rashes   [] Ulcers    [] Wounds Psychological:  [] History of anxiety   []  History of major depression.  Physical Examination  BP 126/83 (BP Location: Left Arm)    Pulse 83    Resp 16    Wt 189 lb (85.7 kg)    BMI 27.91 kg/m  Gen:  WD/WN, NAD.  Appears younger than stated age Head: [place]/AT, No temporalis wasting. Ear/Nose/Throat: Hearing grossly intact, nares w/o erythema or drainage Eyes: Conjunctiva clear. Sclera non-icteric Neck: Supple.  Trachea midline Pulmonary:  Good air movement, no use of accessory muscles.  Cardiac: RRR, no JVD Vascular:  Vessel Right Left  Radial Palpable Palpable                                   Gastrointestinal: soft, non-tender/non-distended. No guarding/reflex.  Musculoskeletal: M/S 5/5 throughout.  No deformity or atrophy.  No edema. Neurologic: Sensation grossly intact in extremities.  Symmetrical.  Speech is fluent.  Psychiatric: Judgment intact, Mood & affect appropriate for pt's clinical situation. Dermatologic: No rashes or ulcers noted.  No cellulitis or open wounds.      Labs Recent Results (from the past 2160 hour(s))  I-STAT creatinine     Status: None   Collection Time: 08/17/21  8:28 AM  Result Value Ref Range   Creatinine, Ser 1.20 0.61 - 1.24 mg/dL    Radiology CT Angio Abd/Pel w/ and/or w/o  Result Date: 08/17/2021 CLINICAL DATA:  Renal artery aneurysm follow-up EXAM: CTA ABDOMEN AND PELVIS WITHOUT AND WITH CONTRAST TECHNIQUE: Multidetector CT imaging of the abdomen and pelvis was performed using the standard protocol during bolus administration of intravenous contrast. Multiplanar reconstructed images and MIPs were obtained and reviewed to evaluate the vascular anatomy. RADIATION DOSE REDUCTION: This exam was performed according to the departmental dose-optimization program which includes automated exposure control, adjustment of the mA and/or kV according to patient size and/or use of iterative reconstruction technique. CONTRAST:  113mL OMNIPAQUE  IOHEXOL 350 MG/ML SOLN COMPARISON:  CT abdomen pelvis 08/19/2019 FINDINGS: VASCULAR Aorta: Scattered atheromatous calcifications seen throughout the abdominal aorta without aneurysm or significant stenosis. Celiac: Patent without evidence of aneurysm, dissection, vasculitis or significant stenosis. SMA: Patent without evidence of aneurysm, dissection, vasculitis or significant stenosis. Renals: Single bilateral renal arteries are noted. No significant stenosis or aneurysm of the left main renal artery. Aneurysm originating from the distal right main renal artery measures 2.5 x 1.7 x 1.7 cm. There are 3 outflow branches and 1 inflow branch. It is unchanged in size since prior study. IMA: Patent without evidence of aneurysm, dissection, vasculitis or significant stenosis. Inflow: Patent without evidence of aneurysm, dissection, vasculitis or significant stenosis. Proximal Outflow: Bilateral common  femoral and visualized portions of the superficial and profunda femoral arteries are patent without evidence of aneurysm, dissection, vasculitis or significant stenosis. Veins: No obvious venous abnormality within the limitations of this arterial phase study. Review of the MIP images confirms the above findings. NON-VASCULAR Lower chest: No acute abnormality. Hepatobiliary: 1.5 cm hypodense lesion again seen in the dome of the right hepatic lobe (8/5). This cannot be definitely characterize as a hemangioma or cyst. Further evaluation with MRI should be performed. Liver, bile ducts, and gallbladder otherwise unremarkable. Pancreas: Unremarkable. No pancreatic ductal dilatation or surrounding inflammatory changes. Spleen: Normal in size without focal abnormality. Adrenals/Urinary Tract: Adrenal glands are normal. 6 mm nonobstructing calculus seen in the upper pole of the right kidney. Multiple nonobstructing left renal calculi measuring up to 4 mm. No hydronephrosis. Multiple bilateral renal cysts are present with the largest  on the left measuring 3.0 cm in the largest on the right measuring 3.9 cm. There is a 1.3 cm exophytic lesion extending laterally from the upper pole the right kidney which cannot be characterized as a simple cyst. Nonobstructing calculus again noted within the right distal ureter measuring 8 mm. Ureters and bladder otherwise unremarkable. Stomach/Bowel: No bowel dilatation to indicate ileus or obstruction. Appendix is normal. Lymphatic: No enlarged abdominal or pelvic lymph nodes. Reproductive: Mildly enlarged prostate. Other: No abdominal wall hernia or abnormality. No abdominopelvic ascites. Musculoskeletal: No acute or significant osseous findings. IMPRESSION: VASCULAR Unchanged aneurysm of the distal right main renal artery measuring 2.5 x 1.7 x 1.7 cm. NON-VASCULAR 1. 1.5 cm right hepatic dome lesion cannot be completely characterized as a hemangioma or cyst on the current exam. Further evaluation with MRI is recommended. 2. 1.3 cm exophytic right upper pole renal lesion cannot be characterized as a simple cyst. Further evaluation with MRI is recommended. 3. Nonobstructing bilateral renal calculi. Nonobstructing right distal ureteral calculus unchanged from prior exam. No hydronephrosis. Electronically Signed   By: Miachel Roux M.D.   On: 08/17/2021 15:56    Assessment/Plan  Aneurysm of right renal artery ([place]) he has undergone a CT angiogram of the abdomen and pelvis which I have independently reviewed.  This demonstrates a 2.5 cm right renal artery aneurysm at the branches of the right main renal artery feeding what appeared to be 3 or 4 different renal artery branches.  There is no left renal artery aneurysm and the left renal artery appears relatively normal.  There is a fairly long main right renal artery before the aneurysm. I had a discussion with the patient at some length today describing the pathophysiology of renal artery aneurysms.  At this size in a relatively young healthy patient, it  should be addressed to reduce the risk of lethal rupture.  I have had discussions with him regarding options for treatment.  1 option would be nephrectomy but this would be the most invasive.  I have discussed that endovascular options for treatment are usually available.  The location of his aneurysm is complex and challenging.  We will not be able to save his entire kidney, and I am not sure we will be able to save any of the kidney.  He may require coil embolization of the entire kidney to definitively address the aneurysm.  My hope would be that we can coil embolize some of the branches and save the largest branch of the kidney to preserve as much function as possible.  This would be coil embolization of branches with covered stent placement to the largest  renal artery branch.  I discussed that we may not know this until the time of the procedure.  The patient voices his understanding.  He is going to discuss this with his wife and selected date give [place] a call to get this scheduled.  Essential hypertension blood pressure control important in reducing the progression of atherosclerotic disease. On appropriate oral medications.   Hyperlipidemia lipid control important in reducing the progression of atherosclerotic disease. Continue statin therapy    Leotis Pain, MD  08/22/2021 9:55 AM    This note was created with Dragon medical transcription system.  Any errors from dictation are purely unintentional

## 2021-08-22 NOTE — Assessment & Plan Note (Signed)
lipid control important in reducing the progression of atherosclerotic disease. Continue statin therapy  

## 2021-08-22 NOTE — Assessment & Plan Note (Signed)
blood pressure control important in reducing the progression of atherosclerotic disease. On appropriate oral medications.  

## 2021-08-22 NOTE — Assessment & Plan Note (Signed)
he has undergone a CT angiogram of the abdomen and pelvis which I have independently reviewed.  This demonstrates a 2.5 cm right renal artery aneurysm at the branches of the right main renal artery feeding what appeared to be 3 or 4 different renal artery branches.  There is no left renal artery aneurysm and the left renal artery appears relatively normal.  There is a fairly long main right renal artery before the aneurysm. I had a discussion with the patient at some length today describing the pathophysiology of renal artery aneurysms.  At this size in a relatively young healthy patient, it should be addressed to reduce the risk of lethal rupture.  I have had discussions with him regarding options for treatment.  1 option would be nephrectomy but this would be the most invasive.  I have discussed that endovascular options for treatment are usually available.  The location of his aneurysm is complex and challenging.  We will not be able to save his entire kidney, and I am not sure we will be able to save any of the kidney.  He may require coil embolization of the entire kidney to definitively address the aneurysm.  My hope would be that we can coil embolize some of the branches and save the largest branch of the kidney to preserve as much function as possible.  This would be coil embolization of branches with covered stent placement to the largest renal artery branch.  I discussed that we may not know this until the time of the procedure.  The patient voices his understanding.  He is going to discuss this with his wife and selected date give [place] a call to get this scheduled.

## 2021-08-28 ENCOUNTER — Telehealth (INDEPENDENT_AMBULATORY_CARE_PROVIDER_SITE_OTHER): Payer: Self-pay | Admitting: Vascular Surgery

## 2021-08-28 ENCOUNTER — Other Ambulatory Visit (INDEPENDENT_AMBULATORY_CARE_PROVIDER_SITE_OTHER): Payer: Self-pay | Admitting: Nurse Practitioner

## 2021-08-28 NOTE — Telephone Encounter (Signed)
Pt. Called and wants to schedule the nephrectomy surgery that Dr. Lucky Cowboy recommended that he should have.  He can be reached at [phone].

## 2021-08-29 ENCOUNTER — Telehealth (INDEPENDENT_AMBULATORY_CARE_PROVIDER_SITE_OTHER): Payer: Self-pay

## 2021-08-29 NOTE — Telephone Encounter (Signed)
Spoke with the patient and he is scheduled with Dr. Lucky Cowboy for a right renal artery aneurysm repair on 09/04/21 with a 6:45 am arrival time to the MM. Pre-procedure instructions were discussed and will be mailed.

## 2021-09-04 ENCOUNTER — Encounter: Payer: Self-pay | Admitting: Vascular Surgery

## 2021-09-04 ENCOUNTER — Other Ambulatory Visit (INDEPENDENT_AMBULATORY_CARE_PROVIDER_SITE_OTHER): Payer: Self-pay | Admitting: Nurse Practitioner

## 2021-09-04 ENCOUNTER — Ambulatory Visit
Admission: RE | Admit: 2021-09-04 | Discharge: 2021-09-04 | Disposition: A | Discharge status: Home / Self Care | Payer: Medicare HMO | Attending: Vascular Surgery | Admitting: Vascular Surgery

## 2021-09-04 ENCOUNTER — Other Ambulatory Visit: Payer: Self-pay

## 2021-09-04 ENCOUNTER — Telehealth (INDEPENDENT_AMBULATORY_CARE_PROVIDER_SITE_OTHER): Payer: Self-pay

## 2021-09-04 ENCOUNTER — Encounter
Admission: RE | Disposition: A | Discharge status: Home / Self Care | Payer: Self-pay | Source: Home / Self Care | Attending: Vascular Surgery

## 2021-09-04 DIAGNOSIS — I10 Essential (primary) hypertension: Secondary | ICD-10-CM | POA: Insufficient documentation

## 2021-09-04 DIAGNOSIS — E782 Mixed hyperlipidemia: Secondary | ICD-10-CM | POA: Diagnosis not present

## 2021-09-04 DIAGNOSIS — I722 Aneurysm of renal artery: Secondary | ICD-10-CM | POA: Diagnosis present

## 2021-09-04 DIAGNOSIS — I2510 Atherosclerotic heart disease of native coronary artery without angina pectoris: Secondary | ICD-10-CM | POA: Diagnosis not present

## 2021-09-04 DIAGNOSIS — Z79899 Other long term (current) drug therapy: Secondary | ICD-10-CM | POA: Diagnosis not present

## 2021-09-04 HISTORY — PX: RENAL ANGIOGRAPHY: CATH118260

## 2021-09-04 LAB — CREATININE, SERUM
Creatinine, Ser: 1.02 mg/dL (ref 0.61–1.24)
GFR, Estimated: >60 mL/min (ref >60)

## 2021-09-04 LAB — BUN: BUN: 15 mg/dL (ref 8–23)

## 2021-09-04 SURGERY — RENAL ANGIOGRAPHY
Anesthesia: Moderate Sedation

## 2021-09-04 MED ORDER — HYDROCODONE-ACETAMINOPHEN 7.5-325 MG PO TABS: 1.0000 | ORAL_TABLET | Freq: Four times a day (QID) | ORAL | 0 refills | Status: DC | PRN

## 2021-09-04 MED ORDER — METHYLPREDNISOLONE SODIUM SUCC 125 MG IJ SOLR
125.0000 mg | Freq: Once | INTRAMUSCULAR | Status: DC | PRN
Start: 2021-09-04 — End: 2021-09-04

## 2021-09-04 MED ORDER — MIDAZOLAM HCL 2 MG/ML PO SYRP: 8.0000 mg | ORAL_SOLUTION | Freq: Once | ORAL | Status: DC | PRN

## 2021-09-04 MED ORDER — MIDAZOLAM HCL 2 MG/2ML IJ SOLN
INTRAMUSCULAR | Status: DC | PRN
Start: 2021-09-04 — End: 2021-09-04
  Administered 2021-09-04: 2 mg via INTRAVENOUS
  Administered 2021-09-04 (×2): 1 mg via INTRAVENOUS

## 2021-09-04 MED ORDER — FENTANYL CITRATE (PF) 100 MCG/2ML IJ SOLN
INTRAMUSCULAR | Status: DC | PRN
  Administered 2021-09-04 (×2): 25 ug via INTRAVENOUS
  Administered 2021-09-04: 50 ug via INTRAVENOUS

## 2021-09-04 MED ORDER — ONDANSETRON HCL 4 MG/2ML IJ SOLN: 4.0000 mg | Freq: Four times a day (QID) | INTRAMUSCULAR | Status: DC | PRN

## 2021-09-04 MED ORDER — HEPARIN SODIUM (PORCINE) 1000 UNIT/ML IJ SOLN
INTRAMUSCULAR | Status: DC | PRN
  Administered 2021-09-04: 4000 [IU] via INTRAVENOUS

## 2021-09-04 MED ORDER — MIDAZOLAM HCL 2 MG/2ML IJ SOLN
INTRAMUSCULAR | Status: AC
  Filled 2021-09-04: qty 2

## 2021-09-04 MED ORDER — ASPIRIN EC 81 MG PO TBEC: 81.0000 mg | DELAYED_RELEASE_TABLET | Freq: Every day | ORAL | 2 refills | Status: DC

## 2021-09-04 MED ORDER — FAMOTIDINE 20 MG PO TABS
40.0000 mg | ORAL_TABLET | Freq: Once | ORAL | Status: DC | PRN
Start: 2021-09-04 — End: 2021-09-04

## 2021-09-04 MED ORDER — HYDROMORPHONE HCL 1 MG/ML IJ SOLN: 1.0000 mg | Freq: Once | INTRAMUSCULAR | Status: DC | PRN

## 2021-09-04 MED ORDER — CEFAZOLIN SODIUM-DEXTROSE 2-4 GM/100ML-% IV SOLN: 2.0000 g | Freq: Once | INTRAVENOUS | Status: AC

## 2021-09-04 MED ORDER — FENTANYL CITRATE PF 50 MCG/ML IJ SOSY
PREFILLED_SYRINGE | INTRAMUSCULAR | Status: AC
  Filled 2021-09-04: qty 1

## 2021-09-04 MED ORDER — HYDROCODONE-ACETAMINOPHEN 5-325 MG PO TABS
2.0000 | ORAL_TABLET | Freq: Four times a day (QID) | ORAL | 0 refills | Status: DC | PRN
Start: 2021-09-04 — End: 2021-09-04

## 2021-09-04 MED ORDER — DIPHENHYDRAMINE HCL 50 MG/ML IJ SOLN: 50.0000 mg | Freq: Once | INTRAMUSCULAR | Status: DC | PRN

## 2021-09-04 MED ORDER — SODIUM CHLORIDE 0.9 % IV SOLN: INTRAVENOUS | Status: DC

## 2021-09-04 MED ORDER — CLOPIDOGREL BISULFATE 75 MG PO TABS: 75.0000 mg | ORAL_TABLET | Freq: Every day | ORAL | 11 refills | Status: DC

## 2021-09-04 MED ORDER — IODIXANOL 320 MG/ML IV SOLN
INTRAVENOUS | Status: DC | PRN
Start: 2021-09-04 — End: 2021-09-04
  Administered 2021-09-04: 45 mL

## 2021-09-04 MED ORDER — MIDAZOLAM HCL 2 MG/2ML IJ SOLN
INTRAMUSCULAR | Status: DC
Start: 2021-09-04 — End: 2021-09-04
  Filled 2021-09-04: qty 2

## 2021-09-04 MED ORDER — HEPARIN SODIUM (PORCINE) 1000 UNIT/ML IJ SOLN
INTRAMUSCULAR | Status: AC
  Filled 2021-09-04: qty 10

## 2021-09-04 MED ORDER — CEFAZOLIN SODIUM-DEXTROSE 2-4 GM/100ML-% IV SOLN
INTRAVENOUS | Status: AC
  Administered 2021-09-04: 2 g via INTRAVENOUS
  Filled 2021-09-04: qty 100

## 2021-09-04 SURGICAL SUPPLY — 23 items
CATH ANGIO 5F PIGTAIL 65CM (CATHETERS) ×1 IMPLANT
CATH C2 65CM (CATHETERS) ×1 IMPLANT
CATH MICROCATH PRGRT 2.8F 110 (CATHETERS) IMPLANT
COIL 400 COMPLEX SOFT 6X20CM (Vascular Products) ×1 IMPLANT
COIL 400 COMPLEX SOFT 6X30CM (Vascular Products) ×1 IMPLANT
COIL 400 COMPLEX STD 14X60CM (Vascular Products) ×2 IMPLANT
COVER PROBE U/S 5X48 (MISCELLANEOUS) ×1 IMPLANT
DEVICE OCCLUSION PODJ30 (Vascular Products) IMPLANT
DEVICE STARCLOSE SE CLOSURE (Vascular Products) ×1 IMPLANT
DEVICE TORQUE .025-.038 (MISCELLANEOUS) ×1 IMPLANT
GLIDECATH 4FR STR (CATHETERS) ×1 IMPLANT
GLIDEWIRE STIFF .35X180X3 HYDR (WIRE) ×1 IMPLANT
GUIDEWIRE VERSACORE 175CM (WIRE) ×1 IMPLANT
HANDLE DETACHMENT COIL (MISCELLANEOUS) ×1 IMPLANT
MICROCATH PROGREAT 2.8F 110 CM (CATHETERS) ×2
OCCLUSION DEVICE PODJ30 (Vascular Products) ×2 IMPLANT
PACK ANGIOGRAPHY (CUSTOM PROCEDURE TRAY) ×2 IMPLANT
SHEATH ANL2 6FRX45 HC (SHEATH) ×1 IMPLANT
SHEATH BRITE TIP 5FRX11 (SHEATH) ×1 IMPLANT
STENT VIABAHN 6X50X120 (Permanent Stent) ×1 IMPLANT
TUBING CONTRAST HIGH PRESS 72 (TUBING) ×1 IMPLANT
WIRE G 018X200 V18 (WIRE) ×1 IMPLANT
WIRE GUIDERIGHT .035X150 (WIRE) ×1 IMPLANT

## 2021-09-04 NOTE — Op Note (Signed)
[facility]  Percutaneous Study/Intervention Procedural Note    Surgeon(s): [employer]   Assistants: None  Pre-operative Diagnosis: Large right renal artery aneurysm  Post-operative diagnosis:  Same  Procedure(s) Performed:             1.  Ultrasound guidance for vascular access right femoral artery             2.  Catheter placement into right middle and upper pole branches of the right renal artery from right femoral approach             3.  Aortogram and selective and superselective right renal angiogram             4.  Coil embolization of the right renal upper pole branch as well as the aneurysm itself with a total of 5 Ruby coils  5.  Covered stent placement in the right renal artery extending into the middle of the 3 renal artery branches back into the main renal artery with a 6 mm diameter by 5 cm length Viabahn stent             6.  StarClose closure device right femoral artery              Contrast: 45 cc  EBL: 5 cc  Fluoro Time: 6.5 minutes  Moderate conscious sedation: Approximately 61 minutes with 4 mg of Versed and 100 mcg of Fentanyl  Indications:  The patient is a 66 year old male with an enlarging right renal artery aneurysm that is greater than 2.5 cm in maximal diameter.  Given the clinical scenario and the noninvasive findings, angiogram is indicated for further evaluation of her renal artery and potential treatment. Risks and benefits are discussed and informed consent is obtained.  Procedure:  The patient was identified and appropriate procedural time out was performed.  The patient was then placed supine on the table and prepped and draped in the usual sterile fashion. Moderate conscious sedation was administered with a face to face encounter with the patient throughout the procedure with my supervision of the RN administering medicines and monitoring the patients vital signs and mental status throughout from the start of the procedure  until the patient was taken to the recovery room  Ultrasound was used to evaluate the right common femoral artery.  It was patent .  A digital ultrasound image was acquired.  A Seldinger needle was used to access the right common femoral artery under direct ultrasound guidance and a permanent image was performed.  A 0.035 J wire was advanced without resistance and a 5Fr sheath was placed.  Pigtail catheter was placed into the aorta at the L1 level and an AP aortogram was performed. This demonstrated that the aorta and iliac arteries are widely patent.  The right renal artery has no significant stenosis, but there is a very large right renal artery aneurysm measuring well over 2 cm in diameter and likely closer to 3 cm at the trifurcation of the main right renal artery.  I then used a C2 catheter to selectively cannulate the main right renal artery to get better imaging.  There were 3 primary branches that arose off of the aneurysm.  I then used a prograde microcatheter and selectively cannulated first in the middle of the 3 primary branches that seem to feed about 40 to 50% of the kidney and was likely the best target for covered stent placement to salvage some of the renal function.  I  then cannulated the upper pole branch of the right renal artery that arose off of the aneurysm and saw that it did provide some flow to the kidney but not as much as the middle branch.  The inferior branch was the smallest of the 3 and seem to provide the least amount of flow to the kidney but was not selectively cannulated due to its location and steep angle. The patient was then systemically heparinized with 4000 units of intravenous heparin.  A 6 French Ansell sheath was placed and after recannulating the middle branch with a versa core wire, I used a glide catheter and confirmed intraluminal flow and exchanged for a V18 wire.  A prograde microcatheter was then used to cannulate the upper pole renal artery and a buddy wire  technique.  I then deployed a pair of 6 mm diameter coils into the upper pole right renal artery.  The first is 20 cm in length and the second was 30 cm in length.  This took [place] back to the aneurysm sac itself.  I then pulled the prograde microcatheter to the inferior portion of the large aneurysm and filled the sac with a total of 3 coils.  The first 2 were 14 mm diameter by 60 cm standard and then a 30 cm packing coil.  Imaging was performed and the prograde microcatheter was then removed.  A 6 mm diameter by 5 cm length Viabahn stent was then deployed into the middle of the 3 renal artery branches about a centimeter beyond the aneurysm and then back into the midportion of the right renal artery about 2 cm before the aneurysm started.  Successful exclusion of the renal artery aneurysm with successful coil embolization of the aneurysm itself as well as the superior pole branch was seen on completion imaging through the [facility] sheath.  Oblique arteriogram was performed of the right femoral artery and StarClose closure device was deployed in the usual fashion with excellent hemostatic result. The patient was taken to the recovery room in stable condition having tolerated the procedure well.  Findings:               Aortogram/Renal Arteries: The aorta and iliac arteries are widely patent.  The right renal artery has no significant stenosis, but there is a very large right renal artery aneurysm measuring well over 2 cm in diameter and likely closer to 3 cm at the trifurcation of the main right renal artery.  There were 3 primary branches that arose off of the aneurysm.   Condition:  Stable  Complications: None   Leotis Pain 09/04/2021 9:41 AM  This note was created with Dragon Medical transcription system. Any errors in dictation are purely unintentional.

## 2021-09-04 NOTE — Interval H&P Note (Signed)
History and Physical Interval Note:  09/04/2021 8:14 AM  Bryan Simpson  has presented today for surgery, with the diagnosis of RT renal artery aneurysm repair    Renal artery aneurysm.  The various methods of treatment have been discussed with the patient and family. After consideration of risks, benefits and other options for treatment, the patient has consented to  Procedure(s): RENAL ANGIOGRAPHY (N/A) as a surgical intervention.  The patient's history has been reviewed, patient examined, no change in status, stable for surgery.  I have reviewed the patient's chart and labs.  Questions were answered to the patient's satisfaction.     Leotis Pain

## 2021-09-04 NOTE — Telephone Encounter (Signed)
Patient spouse left a message requesting for something different to called into pharmacy due the previous medication is on back order. Patient had renal angio done with Dr Lucky Cowboy on today. Nolen Mu NP has sent in Hydrocodone-acetaminophen 7.5-325mg  to pharmacy and patient spouse was made aware.

## 2021-09-05 ENCOUNTER — Telehealth (INDEPENDENT_AMBULATORY_CARE_PROVIDER_SITE_OTHER): Payer: Self-pay

## 2021-09-05 NOTE — Telephone Encounter (Signed)
Patient spouse left a voicemail stating that her husband is having a lot of nausea,stomach sore,and continuing of hiccups. Patient has tried crackers,yogurt,etc and is not able to keep anything down. I spoke with Dr Lucky Cowboy and recommended that the patient drink plenty of fluids and if the symptoms continue he should evaluated at the ED. I will call in Zofran 4mg  q every 4 hours prn #30 to pharmacy. Patient spouse has been made aware with medical recommendations.

## 2021-09-07 ENCOUNTER — Telehealth (INDEPENDENT_AMBULATORY_CARE_PROVIDER_SITE_OTHER): Payer: Self-pay

## 2021-09-07 NOTE — Telephone Encounter (Signed)
Unfortunately the pain is not unexpected due to some of the coiling of the aneurysm.  If he hasn't had a BM, that too can cause stomach pain and bloating and it wouldn't be surprising if he were constipated due the pain medications given during the procedure and taken after.  Zofran especially will make you bloated and constipated. He should take a stool softner and miralax (you can get these over the counter) to help with constipation until he can have a good BM and that may help.  Also a heating pad over the stomach may help as well.  As far as the burning we can send him in some antibiotics to be sure to cover for a possible UTI .  As far as the hydrocodone, I'd stop taking that and we can try either some tramadol (which will be a little weaker) or some oxycodone (a little stronger) to see if these help

## 2021-09-07 NOTE — Telephone Encounter (Signed)
Patient wife was made aware with medical advice and verbalized understanding. Patient spouse requested for the Tramadol to be called into pharmacy. Tramadol 50mg  take 1 tablet every 6 hours as needed for pain #30 no refills has been called to the pharmacy on the patient chart ?

## 2021-10-09 ENCOUNTER — Other Ambulatory Visit (INDEPENDENT_AMBULATORY_CARE_PROVIDER_SITE_OTHER): Payer: Self-pay | Admitting: Vascular Surgery

## 2021-10-09 DIAGNOSIS — Z9862 Peripheral vascular angioplasty status: Secondary | ICD-10-CM

## 2021-10-10 ENCOUNTER — Ambulatory Visit (INDEPENDENT_AMBULATORY_CARE_PROVIDER_SITE_OTHER): Payer: Medicare HMO

## 2021-10-10 ENCOUNTER — Ambulatory Visit (INDEPENDENT_AMBULATORY_CARE_PROVIDER_SITE_OTHER): Payer: Medicare HMO | Admitting: Vascular Surgery

## 2021-10-10 ENCOUNTER — Encounter (INDEPENDENT_AMBULATORY_CARE_PROVIDER_SITE_OTHER): Payer: Self-pay | Admitting: Vascular Surgery

## 2021-10-10 VITALS — BP 142/79 | HR 74 | Resp 17 | Ht 69.0 in | Wt 191.0 lb

## 2021-10-10 DIAGNOSIS — I10 Essential (primary) hypertension: Secondary | ICD-10-CM | POA: Diagnosis not present

## 2021-10-10 DIAGNOSIS — Z9862 Peripheral vascular angioplasty status: Secondary | ICD-10-CM

## 2021-10-10 DIAGNOSIS — I722 Aneurysm of renal artery: Secondary | ICD-10-CM | POA: Diagnosis not present

## 2021-10-10 DIAGNOSIS — E785 Hyperlipidemia, unspecified: Secondary | ICD-10-CM

## 2021-10-10 NOTE — Assessment & Plan Note (Signed)
Duplex today shows.  Good flow in both renal arteries without hemodynamically significant stenosis and no flow in the right renal artery aneurysm.  The kidney sizes are 11.5 cm on the right and 11.1 cm on the left.  He is doing well after his endovascular treatment for his right renal artery aneurysm with a combination of coiling and covered stent placement.  No restrictions at this point.  Continue current medical regimen.  Recheck in 6 months. ?

## 2021-10-10 NOTE — Assessment & Plan Note (Signed)
lipid control important in reducing the progression of atherosclerotic disease. Continue statin therapy  

## 2021-10-10 NOTE — Assessment & Plan Note (Signed)
blood pressure control important in reducing the progression of atherosclerotic disease. On appropriate oral medications.  

## 2021-10-10 NOTE — Progress Notes (Signed)
? ? ?MRN : [id number] ? ?Bryan Simpson is a 66 y.o. ([birth year]) male who presents with chief complaint of  ?Chief Complaint  ?Patient presents with  ? Follow-up  ?  Renal ultrasound  ?. ? ?History of Present Illness: Patient returns today in follow up of his right renal artery aneurysm.  A few weeks ago, he underwent a combination of coils and covered stent placement for a large right renal artery aneurysm at the trifurcation of the main renal artery.  He did very well.  He has recovered nicely and has had no periprocedural complications.  He really does not have much pain. Duplex today shows.  Good flow in both renal arteries without hemodynamically significant stenosis and no flow in the right renal artery aneurysm.  The kidney sizes are 11.5 cm on the right and 11.1 cm on the left. ? ?Current Outpatient Medications  ?Medication Sig Dispense Refill  ? aspirin EC 81 MG tablet Take 1 tablet (81 mg total) by mouth daily. 150 tablet 2  ? clopidogrel (PLAVIX) 75 MG tablet Take 1 tablet (75 mg total) by mouth daily. 30 tablet 11  ? lisinopril (ZESTRIL) 5 MG tablet Take 5 mg by mouth daily.    ? pantoprazole (PROTONIX) 40 MG tablet Take 40 mg by mouth daily.    ? rosuvastatin (CRESTOR) 10 MG tablet SMARTSIG:1 Tablet(s) By Mouth Every Evening    ? sildenafil (VIAGRA) 100 MG tablet Take 1 tablet by mouth See admin instructions.    ? HYDROcodone-acetaminophen (NORCO) 7.5-325 MG tablet Take 1 tablet by mouth every 6 (six) hours as needed for moderate pain. (Patient not taking: Reported on 10/10/2021) 30 tablet 0  ? ?No current facility-administered medications for this visit.  ? ? ?Past Medical History:  ?Diagnosis Date  ? Colon polyps 2009  ? Hypertension   ? Mixed hyperlipidemia   ? Nephrolithiasis   ? Non-obstructive CAD (coronary artery disease)   ? a. 08/04/2020 Coronary CTA: LM nl.  LAD proximal and mid calcification with less than 25% stenosis.  Nondominant LCx without plaque.  Dominant RCA with minimal calcification  and less than 25% stenosis.  CAC score 253 (69th percentile).  ? Precordial chest pain ([facility] vs GI)   ? ? ?Past Surgical History:  ?Procedure Laterality Date  ? COLONOSCOPY  2009  ? COLONOSCOPY WITH PROPOFOL N/A 02/15/2021  ? Procedure: COLONOSCOPY WITH PROPOFOL;  Surgeon: Robert Bellow, MD;  Location: [facility] ENDOSCOPY;  Service: Endoscopy;  Laterality: N/A;  ? ESOPHAGOGASTRODUODENOSCOPY (EGD) WITH PROPOFOL N/A 02/15/2021  ? Procedure: ESOPHAGOGASTRODUODENOSCOPY (EGD) WITH PROPOFOL;  Surgeon: Robert Bellow, MD;  Location: [facility] ENDOSCOPY;  Service: Endoscopy;  Laterality: N/A;  ? RENAL ANGIOGRAPHY N/A 09/04/2021  ? Procedure: RENAL ANGIOGRAPHY;  Surgeon: Algernon Huxley, MD;  Location: [facility];  Service: Cardiovascular;  Laterality: N/A;  ? TONSILLECTOMY  age 50  ? ? ? ?Social History  ? ?Tobacco Use  ? Smoking status: Never  ? Smokeless tobacco: Never  ?Vaping Use  ? Vaping Use: Never used  ?Substance Use Topics  ? Alcohol use: Yes  ?  Alcohol/week: 1.0 standard drink  ?  Types: 1 Glasses of wine per week  ? Drug use: No  ? ? ? ? ?Family History  ?Problem Relation Age of Onset  ? Heart Problems Father 68  ?     Heart failure  ? ? ? ?No Known Allergies ? ? ?REVIEW OF SYSTEMS (Negative unless checked) ? ?Constitutional: '[]'$ Weight loss  '[]'$ Fever  '[]'$   Chills ?Cardiac: '[]'$ Chest pain   '[]'$ Chest pressure   '[]'$ Palpitations   '[]'$ Shortness of breath when laying flat   '[]'$ Shortness of breath at rest   '[]'$ Shortness of breath with exertion. ?Vascular:  '[]'$ Pain in legs with walking   '[]'$ Pain in legs at rest   '[]'$ Pain in legs when laying flat   '[]'$ Claudication   '[]'$ Pain in feet when walking  '[]'$ Pain in feet at rest  '[]'$ Pain in feet when laying flat   '[]'$ History of DVT   '[]'$ Phlebitis   '[]'$ Swelling in legs   '[]'$ Varicose veins   '[]'$ Non-healing ulcers ?Pulmonary:   '[]'$ Uses home oxygen   '[]'$ Productive cough   '[]'$ Hemoptysis   '[]'$ Wheeze  '[]'$ COPD   '[]'$ Asthma ?Neurologic:  '[]'$ Dizziness  '[]'$ Blackouts   '[]'$ Seizures   '[]'$ History of stroke   '[]'$ History of TIA   '[]'$ Aphasia   '[]'$ Temporary blindness   '[]'$ Dysphagia   '[]'$ Weakness or numbness in arms   '[]'$ Weakness or numbness in legs ?Musculoskeletal:  '[x]'$ Arthritis   '[]'$ Joint swelling   '[]'$ Joint pain   '[]'$ Low back pain ?Hematologic:  '[]'$ Easy bruising  '[]'$ Easy bleeding   '[]'$ Hypercoagulable state   '[]'$ Anemic   ?Gastrointestinal:  '[]'$ Blood in stool   '[]'$ Vomiting blood  '[]'$ Gastroesophageal reflux/heartburn   '[]'$ Abdominal pain ?Genitourinary:  '[]'$ Chronic kidney disease   '[]'$ Difficult urination  '[]'$ Frequent urination  '[]'$ Burning with urination   '[]'$ Hematuria ?Skin:  '[]'$ Rashes   '[]'$ Ulcers   '[]'$ Wounds ?Psychological:  '[]'$ History of anxiety   '[]'$  History of major depression. ? ?Physical Examination ? ?BP (!) 142/79 (BP Location: Left Arm)   Pulse 74   Resp 17   Ht '5\' 9"'$  (1.753 m)   Wt 191 lb (86.6 kg)   BMI 28.21 kg/m?  ?Gen:  WD/WN, NAD.  Appears younger than stated age ?Head: [place]/AT, No temporalis wasting. ?Ear/Nose/Throat: Hearing grossly intact, nares w/o erythema or drainage ?Eyes: Conjunctiva clear. Sclera non-icteric ?Neck: Supple.  Trachea midline ?Pulmonary:  Good air movement, no use of accessory muscles.  ?Cardiac: RRR, no JVD ?Vascular:  ?Vessel Right Left  ?Radial Palpable Palpable  ?    ?    ?    ?    ?    ?    ?    ?    ? ?Gastrointestinal: soft, non-tender/non-distended. No guarding/reflex.  ?Musculoskeletal: M/S 5/5 throughout.  No deformity or atrophy.  No edema. ?Neurologic: Sensation grossly intact in extremities.  Symmetrical.  Speech is fluent.  ?Psychiatric: Judgment intact, Mood & affect appropriate for pt's clinical situation. ?Dermatologic: No rashes or ulcers noted.  No cellulitis or open wounds.  Access site is well-healed ? ? ? ? ? ?Labs ?Recent Results (from the past 2160 hour(s))  ?I-STAT creatinine     Status: None  ? Collection Time: 08/17/21  8:28 AM  ?Result Value Ref Range  ? Creatinine, Ser 1.20 0.61 - 1.24 mg/dL  ?BUN     Status: None  ? Collection Time: 09/04/21  7:25 AM  ?Result Value Ref Range  ? BUN 15 8 - 23 mg/dL  ?   Comment: Performed at [facility], [address], [place], [place] [address]  ?Creatinine, serum     Status: None  ? Collection Time: 09/04/21  7:25 AM  ?Result Value Ref Range  ? Creatinine, Ser 1.02 0.61 - 1.24 mg/dL  ? GFR, Estimated >60 >60 mL/min  ?  Comment: (NOTE) ?Calculated using the CKD-EPI Creatinine Equation (2021) ?Performed at [facility], [address], [place], [place] ?[address] ?  ? ? ?Radiology ?No results found. ? ?  Assessment/Plan ? ?Aneurysm of right renal artery (HCC) ?Duplex today shows.  Good flow in both renal arteries without hemodynamically significant stenosis and no flow in the right renal artery aneurysm.  The kidney sizes are 11.5 cm on the right and 11.1 cm on the left.  He is doing well after his endovascular treatment for his right renal artery aneurysm with a combination of coiling and covered stent placement.  No restrictions at this point.  Continue current medical regimen.  Recheck in 6 months. ? ?Essential hypertension ?blood pressure control important in reducing the progression of atherosclerotic disease. On appropriate oral medications. ? ? ?Hyperlipidemia ?lipid control important in reducing the progression of atherosclerotic disease. Continue statin therapy ? ? ? ?Leotis Pain, MD ? ?10/10/2021 ?9:32 AM ? ? ? ?This note was created with Dragon medical transcription system.  Any errors from dictation are purely unintentional  ?

## 2022-03-27 ENCOUNTER — Ambulatory Visit (INDEPENDENT_AMBULATORY_CARE_PROVIDER_SITE_OTHER): Payer: Medicare HMO

## 2022-03-27 ENCOUNTER — Ambulatory Visit (INDEPENDENT_AMBULATORY_CARE_PROVIDER_SITE_OTHER): Payer: Medicare HMO | Admitting: Vascular Surgery

## 2022-03-27 ENCOUNTER — Encounter (INDEPENDENT_AMBULATORY_CARE_PROVIDER_SITE_OTHER): Payer: Self-pay | Admitting: Vascular Surgery

## 2022-03-27 VITALS — BP 155/95 | HR 79 | Resp 17 | Ht 68.0 in | Wt 191.0 lb

## 2022-03-27 DIAGNOSIS — I722 Aneurysm of renal artery: Secondary | ICD-10-CM | POA: Diagnosis not present

## 2022-03-27 DIAGNOSIS — I10 Essential (primary) hypertension: Secondary | ICD-10-CM

## 2022-03-27 DIAGNOSIS — E785 Hyperlipidemia, unspecified: Secondary | ICD-10-CM | POA: Diagnosis not present

## 2022-03-27 NOTE — Assessment & Plan Note (Signed)
Duplex showed increased resistive indices but the stent is patent in the right kidney remains 10 cm in diameter.  The left renal artery is normal with no evidence of renal artery stenosis and has a kidney diameter of 10.5 cm.  I would continue the aspirin, statin, and Plavix.  He does have some increased bruising, but is tolerating it okay and due to the peripheral nature of the stent but treated the aneurysm, and a high resistive indices, he does have an increased risk of thrombosis.  We will do another 74-monthfollow-up with duplex.

## 2022-03-27 NOTE — Progress Notes (Signed)
MRN : [id number]  Bryan Simpson is a 66 y.o. (Oct 20, 1955) male who presents with chief complaint of No chief complaint on file. [name]  History of Present Illness: Patient returns today in follow up of renal artery aneurysm.  He underwent a combination of coils and covered stent placement to treat the aneurysm.  With the coils in the location of the aneurysm, losing part of the kidney was inevitable but were able to save a significant portion of the kidney.  His blood pressure has been running pretty good.  As far as he knows, his kidney function is okay and his primary care physician has checked lab work.  He does have some increased bruising but otherwise is tolerating everything well.  He asks about continuing Plavix today.  He denies any pain or other issues at this time.  Duplex showed increased resistive indices but the stent is patent in the right kidney remains 10 cm in diameter.  The left renal artery is normal with no evidence of renal artery stenosis and has a kidney diameter of 10.5 cm.  Current Outpatient Medications  Medication Sig Dispense Refill   aspirin EC 81 MG tablet Take 1 tablet (81 mg total) by mouth daily. 150 tablet 2   clopidogrel (PLAVIX) 75 MG tablet Take 1 tablet (75 mg total) by mouth daily. 30 tablet 11   lisinopril (ZESTRIL) 5 MG tablet Take 5 mg by mouth daily.     pantoprazole (PROTONIX) 40 MG tablet Take 40 mg by mouth daily.     rosuvastatin (CRESTOR) 10 MG tablet SMARTSIG:1 Tablet(s) By Mouth Every Evening     sildenafil (VIAGRA) 100 MG tablet Take 1 tablet by mouth See admin instructions.     No current facility-administered medications for this visit.    Past Medical History:  Diagnosis Date   Colon polyps 2009   Hypertension    Mixed hyperlipidemia    Nephrolithiasis    Non-obstructive CAD (coronary artery disease)    a. 08/04/2020 Coronary CTA: LM nl.  LAD proximal and mid calcification with less than 25% stenosis.  Nondominant LCx without plaque.   Dominant RCA with minimal calcification and less than 25% stenosis.  CAC score 253 (69th percentile).   Precordial chest pain ([facility] vs [facility])     Past Surgical History:  Procedure Laterality Date   COLONOSCOPY  2009   COLONOSCOPY WITH PROPOFOL N/A 02/15/2021   Procedure: COLONOSCOPY WITH PROPOFOL;  Surgeon: Robert Bellow, MD;  Location: [facility];  Service: Endoscopy;  Laterality: N/A;   ESOPHAGOGASTRODUODENOSCOPY (EGD) WITH PROPOFOL N/A 02/15/2021   Procedure: ESOPHAGOGASTRODUODENOSCOPY (EGD) WITH PROPOFOL;  Surgeon: Robert Bellow, MD;  Location: [facility];  Service: Endoscopy;  Laterality: N/A;   RENAL ANGIOGRAPHY N/A 09/04/2021   Procedure: RENAL ANGIOGRAPHY;  Surgeon: Algernon Huxley, MD;  Location: [place] [facility];  Service: Cardiovascular;  Laterality: N/A;   TONSILLECTOMY  age 55     Social History   Tobacco Use   Smoking status: Never   Smokeless tobacco: Never  Vaping Use   Vaping Use: Never used  Substance Use Topics   Alcohol use: Yes    Alcohol/week: 1.0 standard drink of alcohol    Types: 1 Glasses of wine per week   Drug use: No       Family History  Problem Relation Age of Onset   Heart Problems Father 79       Heart failure     No Known Allergies  REVIEW OF SYSTEMS (Negative unless checked)  Constitutional: '[]'$ Weight loss  '[]'$ Fever  '[]'$ Chills Cardiac: '[]'$ Chest pain   '[]'$ Chest pressure   '[]'$ Palpitations   '[]'$ Shortness of breath when laying flat   '[]'$ Shortness of breath at rest   '[]'$ Shortness of breath with exertion. Vascular:  '[]'$ Pain in legs with walking   '[]'$ Pain in legs at rest   '[]'$ Pain in legs when laying flat   '[]'$ Claudication   '[]'$ Pain in feet when walking  '[]'$ Pain in feet at rest  '[]'$ Pain in feet when laying flat   '[]'$ History of DVT   '[]'$ Phlebitis   '[]'$ Swelling in legs   '[]'$ Varicose veins   '[]'$ Non-healing ulcers Pulmonary:   '[]'$ Uses home oxygen   '[]'$ Productive cough   '[]'$ Hemoptysis   '[]'$ Wheeze  '[]'$ COPD   '[]'$ Asthma Neurologic:  '[]'$ Dizziness  '[]'$ Blackouts    '[]'$ Seizures   '[]'$ History of stroke   '[]'$ History of TIA  '[]'$ Aphasia   '[]'$ Temporary blindness   '[]'$ Dysphagia   '[]'$ Weakness or numbness in arms   '[]'$ Weakness or numbness in legs Musculoskeletal:  '[x]'$ Arthritis   '[]'$ Joint swelling   '[]'$ Joint pain   '[]'$ Low back pain Hematologic:  '[x]'$ Easy bruising  '[]'$ Easy bleeding   '[]'$ Hypercoagulable state   '[]'$ Anemic   Gastrointestinal:  '[]'$ Blood in stool   '[]'$ Vomiting blood  '[]'$ Gastroesophageal reflux/heartburn   '[]'$ Abdominal pain Genitourinary:  '[]'$ Chronic kidney disease   '[]'$ Difficult urination  '[]'$ Frequent urination  '[]'$ Burning with urination   '[]'$ Hematuria Skin:  '[]'$ Rashes   '[]'$ Ulcers   '[]'$ Wounds Psychological:  '[]'$ History of anxiety   '[]'$  History of major depression.  Physical Examination  BP (!) 155/95 (BP Location: Right Arm)   Pulse 79   Resp 17   Ht '5\' 8"'$  (1.727 m)   Wt 191 lb (86.6 kg)   BMI 29.04 kg/m  Gen:  WD/WN, NAD Head: [place]/AT, No temporalis wasting. Ear/Nose/Throat: Hearing grossly intact, nares w/o erythema or drainage Eyes: Conjunctiva clear. Sclera non-icteric Neck: Supple.  Trachea midline Pulmonary:  Good air movement, no use of accessory muscles.  Cardiac: RRR, no JVD Vascular:  Vessel Right Left  Radial Palpable Palpable                                   Gastrointestinal: soft, non-tender/non-distended. No guarding/reflex.  Musculoskeletal: M/S 5/5 throughout.  No deformity or atrophy. No edema. Neurologic: Sensation grossly intact in extremities.  Symmetrical.  Speech is fluent.  Psychiatric: Judgment intact, Mood & affect appropriate for pt's clinical situation. Dermatologic: No rashes or ulcers noted.  No cellulitis or open wounds.      Labs No results found for this or any previous visit (from the past 2160 hour(s)).  Radiology No results found.  Assessment/Plan Essential hypertension blood pressure control important in reducing the progression of atherosclerotic disease. On appropriate oral medications.     Hyperlipidemia lipid  control important in reducing the progression of atherosclerotic disease. Continue statin therapy  Aneurysm of right renal artery (HCC) Duplex showed increased resistive indices but the stent is patent in the right kidney remains 10 cm in diameter.  The left renal artery is normal with no evidence of renal artery stenosis and has a kidney diameter of 10.5 cm.  I would continue the aspirin, statin, and Plavix.  He does have some increased bruising, but is tolerating it okay and due to the peripheral nature of the stent but treated the aneurysm, and a high resistive indices, he does have an increased risk of thrombosis.  We will do another 16-monthfollow-up with duplex.    JLeotis Pain MD  03/27/2022 9:16 AM    This note was created with Dragon medical transcription system.  Any errors from dictation are purely unintentional

## 2022-04-10 ENCOUNTER — Ambulatory Visit: Payer: Medicare HMO | Attending: Nurse Practitioner | Admitting: Nurse Practitioner

## 2022-04-10 ENCOUNTER — Encounter (INDEPENDENT_AMBULATORY_CARE_PROVIDER_SITE_OTHER): Payer: Medicare HMO

## 2022-04-10 ENCOUNTER — Ambulatory Visit (INDEPENDENT_AMBULATORY_CARE_PROVIDER_SITE_OTHER): Payer: Medicare HMO | Admitting: Vascular Surgery

## 2022-04-10 ENCOUNTER — Encounter: Payer: Self-pay | Admitting: Nurse Practitioner

## 2022-04-10 VITALS — BP 138/84 | HR 69 | Ht 69.0 in | Wt 187.0 lb

## 2022-04-10 DIAGNOSIS — R072 Precordial pain: Secondary | ICD-10-CM

## 2022-04-10 DIAGNOSIS — I2510 Atherosclerotic heart disease of native coronary artery without angina pectoris: Secondary | ICD-10-CM | POA: Diagnosis not present

## 2022-04-10 DIAGNOSIS — I722 Aneurysm of renal artery: Secondary | ICD-10-CM | POA: Diagnosis not present

## 2022-04-10 DIAGNOSIS — I10 Essential (primary) hypertension: Secondary | ICD-10-CM | POA: Diagnosis not present

## 2022-04-10 DIAGNOSIS — E782 Mixed hyperlipidemia: Secondary | ICD-10-CM

## 2022-04-10 NOTE — Progress Notes (Signed)
Office Visit    Patient Name: Bryan Simpson Date of Encounter: 04/10/2022  Primary Care Provider:  Albina Billet, MD Primary Cardiologist:  Nelva Bush, MD  Chief Complaint    66 year old male with a history of noncardiac chest pain, nonobstructive CAD, hypertension, right renal artery aneurysm status post coiling and stenting, and nephrolithiasis, who presents for follow-up related to CAD.  Past Medical History    Past Medical History:  Diagnosis Date   Aneurysm of right renal artery ([place])    a. 08/2021 s/p Coiling (5 Ruby coils) and 35m x 5cm Viabahn stent.   Colon polyps 2009   Hypertension    Mixed hyperlipidemia    Nephrolithiasis    Non-obstructive CAD (coronary artery disease)    a. 08/04/2020 Coronary CTA: LM nl.  LAD proximal and mid calcification with less than 25% stenosis.  Nondominant LCx without plaque.  Dominant RCA with minimal calcification and less than 25% stenosis.  CAC score 253 (69th percentile).   Precordial chest pain ([facility] vs [facility])    Past Surgical History:  Procedure Laterality Date   COLONOSCOPY  2009   COLONOSCOPY WITH PROPOFOL N/A 02/15/2021   Procedure: COLONOSCOPY WITH PROPOFOL;  Surgeon: BRobert Bellow MD;  Location: [facility];  Service: Endoscopy;  Laterality: N/A;   ESOPHAGOGASTRODUODENOSCOPY (EGD) WITH PROPOFOL N/A 02/15/2021   Procedure: ESOPHAGOGASTRODUODENOSCOPY (EGD) WITH PROPOFOL;  Surgeon: BRobert Bellow MD;  Location: [facility];  Service: Endoscopy;  Laterality: N/A;   RENAL ANGIOGRAPHY N/A 09/04/2021   Procedure: RENAL ANGIOGRAPHY;  Surgeon: DAlgernon Huxley MD;  [facility];  Service: Cardiovascular;  Laterality: N/A;   TONSILLECTOMY  age 89   Allergies  No Known Allergies  History of Present Illness    66year old male with above past medical history including noncardiac chest pain, nonobstructive CAD, hypertension, right renal artery aneurysm, and nephrolithiasis.  He was previously  evaluated in January 2022 in the setting of a 3-year history of intermittent chest pain on the left breast, occurring after eating or drinking (especially alcohol).  Pain seemed to worsen in late 2021 despite ongoing PPI therapy and was worse with certain upper body movements.  Coronary CT angiogram in January 2022 showed minimal, nonobstructive plaque with a coronary calcium score of 253 (69th percentile).  Lifestyle modifications were recommended and statin therapy was subsequently added by primary care.  EGD in August 2022 showed reflux esophagitis and gastric polyps.  Colonoscopy noted for a polyp.  Mr. MClopperwas last seen in cardiology clinic in September 2022 at which time he reported improvement in chest pain symptoms with more regular PPI usage.  In February 2023 in the setting of known right renal artery aneurysm, he underwent coiling and right renal artery stent placement.  Follow-up duplex in September showed increased resistive indices with patent stent.  From a cardiac standpoint, [name] been doing well.  He walks his dog about half mile a day and also walks quite a bit at work and works on cars as well.  He denies chest pain, dyspnea, palpitations, PND, orthopnea, dizziness, syncope, edema, or early satiety.  He has had some issues with elevated blood pressures at the dentist visits and has been taking lisinopril 5 mg daily and notes blood pressures typically in the 120s at home.  Home Medications    Current Outpatient Medications  Medication Sig Dispense Refill   aspirin EC 81 MG tablet Take 1 tablet (81 mg total) by mouth daily.  150 tablet 2   clopidogrel (PLAVIX) 75 MG tablet Take 1 tablet (75 mg total) by mouth daily. 30 tablet 11   lisinopril (ZESTRIL) 5 MG tablet Take 5 mg by mouth daily.     pantoprazole (PROTONIX) 40 MG tablet Take 40 mg by mouth daily.     rosuvastatin (CRESTOR) 10 MG tablet SMARTSIG:1 Tablet(s) By Mouth Every Evening     sildenafil (VIAGRA) 100 MG tablet  Take 1 tablet by mouth See admin instructions.     No current facility-administered medications for this visit.     Review of Systems    He denies chest pain, palpitations, dyspnea, pnd, orthopnea, n, v, dizziness, syncope, edema, weight gain, or early satiety.  All other systems reviewed and are otherwise negative except as noted above.    Physical Exam    VS:  BP 138/84 (BP Location: Left Arm, Patient Position: Sitting, Cuff Size: Normal)   Pulse 69   Ht '5\' 9"'$  (1.753 m)   Wt 187 lb (84.8 kg)   SpO2 99%   BMI 27.62 kg/m  , BMI Body mass index is 27.62 kg/m.     GEN: Well nourished, well developed, in no acute distress. HEENT: normal. Neck: Supple, no JVD, carotid bruits, or masses. Cardiac: RRR, no murmurs, rubs, or gallops. No clubbing, cyanosis, edema.  Radials/PT 2+ and equal bilaterally.  Respiratory:  Respirations regular and unlabored, clear to auscultation bilaterally. GI: Soft, nontender, nondistended, BS + x 4. MS: no deformity or atrophy. Skin: warm and dry, no rash. Neuro:  Strength and sensation are intact. Psych: Normal affect.  Accessory Clinical Findings    ECG personally reviewed by me today -regular sinus rhythm, 69- no acute changes.  Lab Results  Component Value Date   WBC 4.3 08/17/2019   HGB 15.0 08/17/2019   HCT 44.2 08/17/2019   MCV 96.1 08/17/2019   PLT 227 08/17/2019   Lab Results  Component Value Date   CREATININE 1.02 09/04/2021   BUN 15 09/04/2021   NA 142 07/13/2020   K 4.7 07/13/2020   CL 103 07/13/2020   CO2 24 07/13/2020   Lab Results  Component Value Date   ALT 18 08/17/2019   AST 21 08/17/2019   ALKPHOS 41 08/17/2019   BILITOT 0.7 08/17/2019    Assessment & Plan    1.  Noncardiac left lower chest pain/nonobstructive CAD: Previous evaluated for left lower chest pain that was occurring after meals with some position changes.  Coronary CT angiogram showed no significant disease in December 2022, and he has been managed  conservatively.  Subsequent EGD showed reflux esophagitis and gastric polyps, and he has been using Protonix daily without chest pain or dyspnea.  He is on aspirin and Plavix in the setting of renal artery stenting and remains on statin therapy.  2.  Essential hypertension: He is taking lisinopril 5 mg daily.  Blood pressures typically run in the 120s at home but frequently higher at office visits.  He is 138/84 today.  He will continue to follow blood pressures at home.  3.  Hyperlipidemia: Remains on statin therapy and this is followed by his primary care provider.  4.  GERD/reflux esophagitis: Now using PPI daily without recurrent symptoms.  5.  Right renal artery aneurysm: Followed closely by vascular surgery status post coiling and right renal artery stenting.  Due for follow-up duplex in approximately 6 months.  6.  Disposition: Follow-up in clinic in 1 year or sooner if necessary.   Harrell Gave  Sharolyn Douglas, NP 04/10/2022, 8:19 AM

## 2022-04-10 NOTE — Patient Instructions (Signed)
Medication Instructions:  - Your physician recommends that you continue on your current medications as directed. Please refer to the Current Medication list given to you today.  *If you need a refill on your cardiac medications before your next appointment, please call your pharmacy*   Lab Work: - none ordered  If you have labs (blood work) drawn today and your tests are completely normal, you will receive your results only by: [place] (if you have MyChart) OR A paper copy in the mail If you have any lab test that is abnormal or we need to change your treatment, we will call you to review the results.   Testing/Procedures: - none ordered   Follow-Up: At [facility], you and your health needs are our priority.  As part of our continuing mission to provide you with exceptional heart care, we have created designated Provider Care Teams.  These Care Teams include your primary Cardiologist (physician) and Advanced Practice Providers (APPs -  Physician Assistants and Nurse Practitioners) who all work together to provide you with the care you need, when you need it.  We recommend signing up for the patient portal called "MyChart".  Sign up information is provided on this After Visit Summary.  MyChart is used to connect with patients for Virtual Visits (Telemedicine).  Patients are able to view lab/test results, encounter notes, upcoming appointments, etc.  Non-urgent messages can be sent to your provider as well.   To learn more about what you can do with MyChart, go to [email or web address].    Your next appointment:   1 year(s)  The format for your next appointment:   In Person  Provider:   Nelva Bush, MD (only)   Other Instructions N/a  Important Information About Sugar

## 2022-05-07 ENCOUNTER — Encounter (INDEPENDENT_AMBULATORY_CARE_PROVIDER_SITE_OTHER): Payer: Self-pay

## 2022-07-04 ENCOUNTER — Other Ambulatory Visit (INDEPENDENT_AMBULATORY_CARE_PROVIDER_SITE_OTHER): Payer: Self-pay | Admitting: Vascular Surgery

## 2022-09-25 ENCOUNTER — Ambulatory Visit (INDEPENDENT_AMBULATORY_CARE_PROVIDER_SITE_OTHER): Payer: Medicare HMO

## 2022-09-25 ENCOUNTER — Ambulatory Visit (INDEPENDENT_AMBULATORY_CARE_PROVIDER_SITE_OTHER): Payer: Medicare HMO | Admitting: Vascular Surgery

## 2022-09-25 ENCOUNTER — Encounter (INDEPENDENT_AMBULATORY_CARE_PROVIDER_SITE_OTHER): Payer: Self-pay | Admitting: Vascular Surgery

## 2022-09-25 VITALS — BP 136/83 | HR 66 | Resp 18 | Ht 69.0 in | Wt 190.0 lb

## 2022-09-25 DIAGNOSIS — I722 Aneurysm of renal artery: Secondary | ICD-10-CM | POA: Diagnosis not present

## 2022-09-25 DIAGNOSIS — E785 Hyperlipidemia, unspecified: Secondary | ICD-10-CM

## 2022-09-25 DIAGNOSIS — I10 Essential (primary) hypertension: Secondary | ICD-10-CM

## 2022-09-25 NOTE — Assessment & Plan Note (Signed)
His duplex today shows a patent renal artery stent with normal kidney lengths and no aneurysmal degeneration today.  Clinically he is doing well.  I will do another 22-month check or 2 and then we can get follow-up as long as he is stable.

## 2022-09-25 NOTE — Progress Notes (Signed)
MRN : [id number]  Bryan Simpson is a 67 y.o. (1955/12/30) male who presents with chief complaint of  Chief Complaint  Patient presents with   Follow-up    f/u in 6 months with renal  .  History of Present Illness: Patient returns today in follow up of his renal artery aneurysm.  He is about 1 year status post endovascular repair with a combination of coil embolization of branches and covered stents to the main distal and primary branch renal arteries. He is doing well. BP has been good.  No loss of renal function. His duplex today shows a patent renal artery stent with normal kidney lengths and no aneurysmal degeneration today.    Current Outpatient Medications  Medication Sig Dispense Refill   ASPIRIN LOW DOSE 81 MG tablet TAKE 1 TABLET BY MOUTH EVERY DAY 120 tablet 3   clopidogrel (PLAVIX) 75 MG tablet TAKE 1 TABLET BY MOUTH EVERY DAY 90 tablet 3   lisinopril (ZESTRIL) 5 MG tablet Take 5 mg by mouth daily.     pantoprazole (PROTONIX) 40 MG tablet Take 40 mg by mouth daily.     rosuvastatin (CRESTOR) 10 MG tablet SMARTSIG:1 Tablet(s) By Mouth Every Evening     sildenafil (VIAGRA) 100 MG tablet Take 1 tablet by mouth See admin instructions.     No current facility-administered medications for this visit.    Past Medical History:  Diagnosis Date   Aneurysm of right renal artery ([place])    a. 08/2021 s/p Coiling (5 Ruby coils) and 61mm x 5cm Viabahn stent.   Colon polyps 2009   Hypertension    Mixed hyperlipidemia    Nephrolithiasis    Non-obstructive CAD (coronary artery disease)    a. 08/04/2020 Coronary CTA: LM nl.  LAD proximal and mid calcification with less than 25% stenosis.  Nondominant LCx without plaque.  Dominant RCA with minimal calcification and less than 25% stenosis.  CAC score 253 (69th percentile).   Precordial chest pain ([facility] vs [facility])     Past Surgical History:  Procedure Laterality Date   COLONOSCOPY  2009   COLONOSCOPY WITH PROPOFOL N/A 02/15/2021   Procedure:  COLONOSCOPY WITH PROPOFOL;  Surgeon: Robert Bellow, MD;  Location: [facility];  Service: Endoscopy;  Laterality: N/A;   ESOPHAGOGASTRODUODENOSCOPY (EGD) WITH PROPOFOL N/A 02/15/2021   Procedure: ESOPHAGOGASTRODUODENOSCOPY (EGD) WITH PROPOFOL;  Surgeon: Robert Bellow, MD;  Location: [facility];  Service: Endoscopy;  Laterality: N/A;   RENAL ANGIOGRAPHY N/A 09/04/2021   Procedure: RENAL ANGIOGRAPHY;  Surgeon: Algernon Huxley, MD;  Location: [place] [facility];  Service: Cardiovascular;  Laterality: N/A;   TONSILLECTOMY  age 74     Social History   Tobacco Use   Smoking status: Never   Smokeless tobacco: Never  Vaping Use   Vaping Use: Never used  Substance Use Topics   Alcohol use: Yes    Alcohol/week: 1.0 standard drink of alcohol    Types: 1 Glasses of wine per week   Drug use: No      Family History  Problem Relation Age of Onset   Heart Problems Father 26       Heart failure     No Known Allergies   REVIEW OF SYSTEMS (Negative unless checked)  Constitutional: [] Weight loss  [] Fever  [] Chills Cardiac: [] Chest pain   [] Chest pressure   [] Palpitations   [] Shortness of breath when laying flat   [] Shortness of breath at rest   [] Shortness of breath with  exertion. Vascular:  [] Pain in legs with walking   [] Pain in legs at rest   [] Pain in legs when laying flat   [] Claudication   [] Pain in feet when walking  [] Pain in feet at rest  [] Pain in feet when laying flat   [] History of DVT   [] Phlebitis   [] Swelling in legs   [] Varicose veins   [] Non-healing ulcers Pulmonary:   [] Uses home oxygen   [] Productive cough   [] Hemoptysis   [] Wheeze  [] COPD   [] Asthma Neurologic:  [] Dizziness  [] Blackouts   [] Seizures   [] History of stroke   [] History of TIA  [] Aphasia   [] Temporary blindness   [] Dysphagia   [] Weakness or numbness in arms   [] Weakness or numbness in legs Musculoskeletal:  [x] Arthritis   [] Joint swelling   [] Joint pain   [] Low back pain Hematologic:  [x] Easy  bruising  [] Easy bleeding   [] Hypercoagulable state   [] Anemic   Gastrointestinal:  [] Blood in stool   [] Vomiting blood  [] Gastroesophageal reflux/heartburn   [] Abdominal pain Genitourinary:  [] Chronic kidney disease   [] Difficult urination  [] Frequent urination  [] Burning with urination   [] Hematuria Skin:  [] Rashes   [] Ulcers   [] Wounds Psychological:  [] History of anxiety   []  History of major depression.  Physical Examination  BP 136/83 (BP Location: Right Arm)   Pulse 66   Resp 18   Ht 5\' 9"  (1.753 m)   Wt 190 lb (86.2 kg)   BMI 28.06 kg/m  Gen:  WD/WN, NAD Head: [place]/AT, No temporalis wasting. Ear/Nose/Throat: Hearing grossly intact, nares w/o erythema or drainage Eyes: Conjunctiva clear. Sclera non-icteric Neck: Supple.  Trachea midline Pulmonary:  Good air movement, no use of accessory muscles.  Cardiac: RRR, no JVD Vascular:  Vessel Right Left  Radial Palpable Palpable               Musculoskeletal: M/S 5/5 throughout.  No deformity or atrophy. No edema. Neurologic: Sensation grossly intact in extremities.  Symmetrical.  Speech is fluent.  Psychiatric: Judgment intact, Mood & affect appropriate for pt's clinical situation. Dermatologic: No rashes or ulcers noted.  No cellulitis or open wounds.      Labs No results found for this or any previous visit (from the past 2160 hour(s)).  Radiology No results found.  Assessment/Plan Essential hypertension blood pressure control important in reducing the progression of atherosclerotic disease. On appropriate oral medications.     Hyperlipidemia lipid control important in reducing the progression of atherosclerotic disease. Continue statin therapy  Aneurysm of right renal artery (HCC) His duplex today shows a patent renal artery stent with normal kidney lengths and no aneurysmal degeneration today.  Clinically he is doing well.  I will do another 66-month check or 2 and then we can get follow-up as long as he is  stable.    Leotis Pain, MD  09/25/2022 9:13 AM    This note was created with Dragon medical transcription system.  Any errors from dictation are purely unintentional

## 2022-10-20 IMAGING — CT CT HEART MORP W/ CTA COR W/ SCORE W/ CA W/CM &/OR W/O CM
1 of 14 series · 3 of 20 positions shown, 4 images · non-contrast
Comparison: None.

Addendum:
CLINICAL DATA: Chest pain

EXAM:
Cardiac/Coronary  CTA
TECHNIQUE: The patient was scanned on a Siemens Somatoform go.Top scanner.

[Series 44: ms multiphase cta coronary 0.60 · axial · 0.35mm/px · z∈[-1138,-1080]mm · 3 of 2646 slices shown, 4 images]
[im 662/2646  vessel]
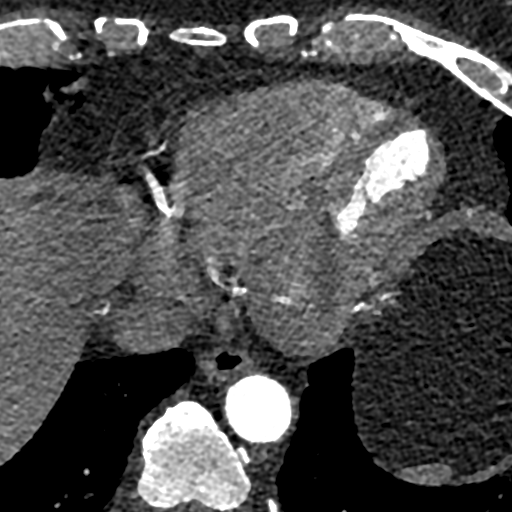
[im 662/2646  lung]
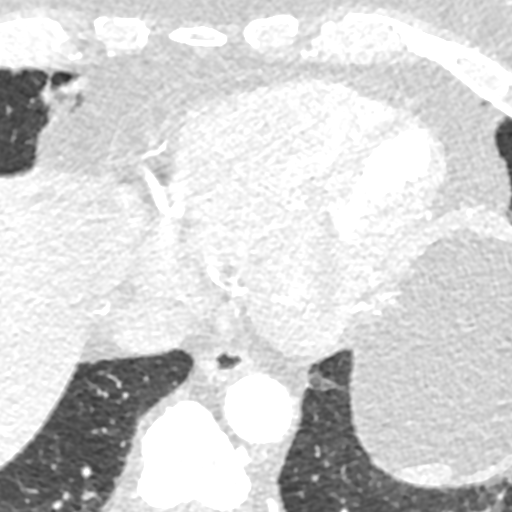
[im 1323/2646  vessel]
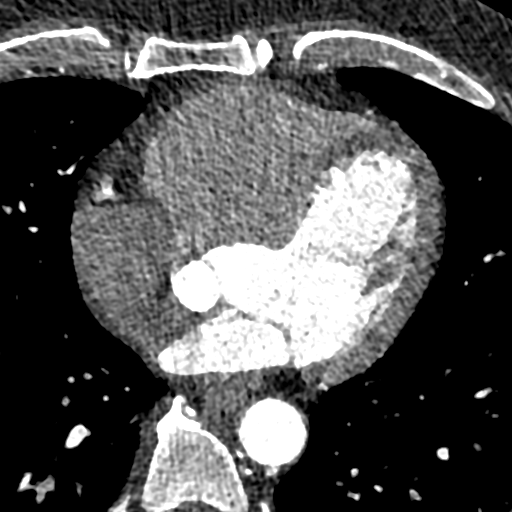
[im 1984/2646  vessel]
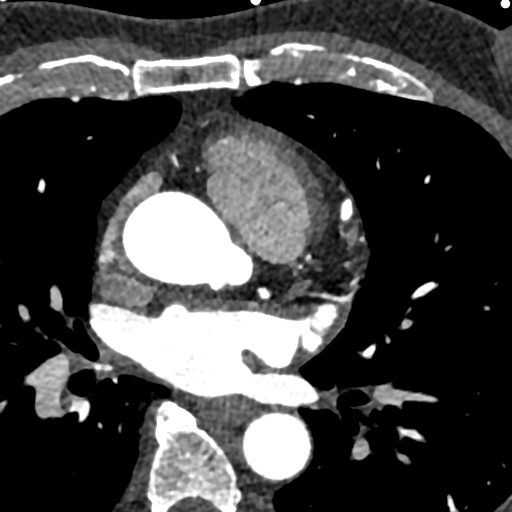

[3 of 20 positions shown; findings below may reference images not displayed]

FINDINGS: A retrospective scan was triggered in the descending thoracic aorta.
Axial non-contrast 3 mm slices were carried out through the heart.
The data set was analyzed on a dedicated work station and scored
using the Agatson method. Gantry rotation speed was 330 msecs and
collimation was .6 mm. 100mg of metoprolol and 0.8 mg of sl NTG was
given. The 3D data set was reconstructed in 5% intervals of the
60-95 % of the R-R cycle. Diastolic phases were analyzed on a
dedicated work station using MPR, MIP and VRT modes. The patient
received 75 cc of contrast.

Aorta: Normal size. Minimal aortic root calcifications. No
dissection.

Aortic Valve:  Trileaflet.  No calcifications.

Coronary Arteries:  Normal coronary origin.  Right dominance.

RCA is a dominant artery that gives rise to PDA and PLA. There is
minimal calcifications in the ostium and distal RCA both causing
<25% stenosis.

Left main gives rise to LAD and LCX arteries.  There is no disease

LAD has calcified plaque in the proximal and mid segments all <25%
stenosis.

LCX is a non-dominant artery that gives rise to two obtuse marginal
branches. There is no plaque.

Other findings:

Normal pulmonary vein drainage into the left atrium.

Normal left atrial appendage without a thrombus.

Normal size of the pulmonary artery.
IMPRESSION: 1. Coronary calcium score of 253. This was 69th percentile for age
and sex matched control.

2. Normal coronary origin with right dominance.

3. Mild calcifications causing minimal disease (<25%) in the prox
RCA, mid LAD

4. CAD-RADS 1. Minimal non-obstructive CAD (0-24%). Consider
non-atherosclerotic causes of chest pain. Consider preventive
therapy and risk factor modification.

EXAM:
OVER-READ INTERPRETATION  CT CHEST

The following report is an over-read performed by radiologist Dr.
Blondinacka Samsonaite [REDACTED] on 08/04/2020. This over-read
does not include interpretation of cardiac or coronary anatomy or
pathology. The CTA interpretation by the cardiologist is attached.
FINDINGS: The visualized portions of the lower lung fields show no suspicious
nodules, masses, or infiltrates. No pleural fluid seen.

The visualized portions of the mediastinum and chest wall are
unremarkable.
IMPRESSION: No significant non-cardiovascular abnormality seen in visualized
portion of the thorax.

*** End of Addendum ***
FINDINGS: A retrospective scan was triggered in the descending thoracic aorta.
Axial non-contrast 3 mm slices were carried out through the heart.
The data set was analyzed on a dedicated work station and scored
using the Agatson method. Gantry rotation speed was 330 msecs and
collimation was .6 mm. 100mg of metoprolol and 0.8 mg of sl NTG was
given. The 3D data set was reconstructed in 5% intervals of the
60-95 % of the R-R cycle. Diastolic phases were analyzed on a
dedicated work station using MPR, MIP and VRT modes. The patient
received 75 cc of contrast.

Aorta: Normal size. Minimal aortic root calcifications. No
dissection.

Aortic Valve:  Trileaflet.  No calcifications.

Coronary Arteries:  Normal coronary origin.  Right dominance.

RCA is a dominant artery that gives rise to PDA and PLA. There is
minimal calcifications in the ostium and distal RCA both causing
<25% stenosis.

Left main gives rise to LAD and LCX arteries.  There is no disease

LAD has calcified plaque in the proximal and mid segments all <25%
stenosis.

LCX is a non-dominant artery that gives rise to two obtuse marginal
branches. There is no plaque.

Other findings:

Normal pulmonary vein drainage into the left atrium.

Normal left atrial appendage without a thrombus.

Normal size of the pulmonary artery.
IMPRESSION: 1. Coronary calcium score of 253. This was 69th percentile for age
and sex matched control.

2. Normal coronary origin with right dominance.

3. Mild calcifications causing minimal disease (<25%) in the prox
RCA, mid LAD

4. CAD-RADS 1. Minimal non-obstructive CAD (0-24%). Consider
non-atherosclerotic causes of chest pain. Consider preventive
therapy and risk factor modification.

## 2023-01-10 ENCOUNTER — Encounter: Payer: Self-pay | Admitting: Internal Medicine

## 2023-03-19 ENCOUNTER — Ambulatory Visit (INDEPENDENT_AMBULATORY_CARE_PROVIDER_SITE_OTHER): Payer: Medicare HMO

## 2023-03-19 DIAGNOSIS — I722 Aneurysm of renal artery: Secondary | ICD-10-CM

## 2023-03-29 ENCOUNTER — Ambulatory Visit (INDEPENDENT_AMBULATORY_CARE_PROVIDER_SITE_OTHER): Payer: Medicare HMO | Admitting: Vascular Surgery

## 2023-04-02 ENCOUNTER — Encounter (INDEPENDENT_AMBULATORY_CARE_PROVIDER_SITE_OTHER): Payer: Medicare HMO

## 2023-04-02 ENCOUNTER — Ambulatory Visit (INDEPENDENT_AMBULATORY_CARE_PROVIDER_SITE_OTHER): Payer: Medicare HMO | Admitting: Vascular Surgery

## 2023-04-22 NOTE — Progress Notes (Unsigned)
Office Visit    Patient Name: Bryan Simpson Date of Encounter: 04/23/2023  Primary Care Provider:  Jaclyn Shaggy, MD Primary Cardiologist:  Yvonne Kendall, MD  Chief Complaint    67 y.o. male  with a history of noncardiac chest pain, nonobstructive CAD, hypertension, right renal artery aneurysm status post coiling and stenting, and nephrolithiasis, who presents for follow-up related to CAD.   Past Medical History    Past Medical History:  Diagnosis Date   Aneurysm of right renal artery (HCC)    a. 08/2021 s/p Coiling (5 Ruby coils) and 6mm x 5cm Viabahn stent.   Colon polyps 2009   Hypertension    Mixed hyperlipidemia    Nephrolithiasis    Non-obstructive CAD (coronary artery disease)    a. 08/04/2020 Coronary CTA: LM nl.  LAD proximal and mid calcification with less than 25% stenosis.  Nondominant LCx without plaque.  Dominant RCA with minimal calcification and less than 25% stenosis.  CAC score 253 (69th percentile).   Precordial chest pain ([facility] vs [facility])    Past Surgical History:  Procedure Laterality Date   COLONOSCOPY  2009   COLONOSCOPY WITH PROPOFOL N/A 02/15/2021   Procedure: COLONOSCOPY WITH PROPOFOL;  Surgeon: [name], MD;  Location: [facility];  Service: Endoscopy;  Laterality: N/A;   ESOPHAGOGASTRODUODENOSCOPY (EGD) WITH PROPOFOL N/A 02/15/2021   Procedure: ESOPHAGOGASTRODUODENOSCOPY (EGD) WITH PROPOFOL;  Surgeon: [name], MD;  Location: [facility] END[facility];  Service: Endoscopy;  Laterality: N/A;   RENAL ANGIOGRAPHY N/A 09/04/2021   Procedure: RENAL ANGIOGRAPHY;  Surgeon: Annice Needy, MD;  Location: [facility];  Service: Cardiovascular;  Laterality: N/A;   TONSILLECTOMY  age 54    Allergies  No Known Allergies  History of Present Illness      67 y.o. y/o male with above past medical history including noncardiac chest pain, nonobstructive CAD, hypertension, right renal artery aneurysm, and nephrolithiasis. He was previously  evaluated in January 2022 in the setting of a 3-year history of intermittent chest pain on the left breast, occurring after eating or drinking (especially alcohol). Pain seemed to worsen in late 2021 despite ongoing PPI therapy and was worse with certain upper body movements. Coronary CT angiogram in January 2022 showed minimal, nonobstructive plaque with a coronary calcium score of 253 (69th percentile). Lifestyle modifications were recommended and statin therapy was subsequently added by primary care. EGD in August 2022 showed reflux esophagitis and gastric polyps. Colonoscopy noted for a polyp.   In February 2023 in the setting of known right renal artery aneurysm, he underwent coiling and right renal artery stent placement.    Bryan Simpson was last seen in cardiology clinic in 04/2022, at which time he was doing well.  Over the past year, he has been reasonably active.  He walks his dog for at least 15 minutes daily and often vigorously.  He plays golf at least twice a month.  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  With the exception of 1 episode of getting overheated while doing yard work and 100 degree weather over the summer, where he felt lightheaded and dizzy, he has done well.  He denies chest pain, palpitations, dyspnea, PND, orthopnea, syncope, edema, or early satiety  Objective   Home Medications    Current Outpatient Medications  Medication Sig Dispense Refill   ASPIRIN LOW DOSE 81 MG tablet TAKE 1 TABLET BY MOUTH EVERY DAY 120 tablet 3   clopidogrel (  PLAVIX) 75 MG tablet TAKE 1 TABLET BY MOUTH EVERY DAY 90 tablet 3   lisinopril (ZESTRIL) 5 MG tablet Take 5 mg by mouth daily.     pantoprazole (PROTONIX) 40 MG tablet Take 40 mg by mouth daily.     rosuvastatin (CRESTOR) 10 MG tablet SMARTSIG:1 Tablet(s) By Mouth Every Evening     sildenafil (VIAGRA) 100 MG tablet Take 1 tablet by mouth See admin instructions.     No current facility-administered medications for this  visit.     Review of Systems    He denies chest pain, palpitations, dyspnea, pnd, orthopnea, n, v, dizziness, syncope, edema, weight gain, or early satiety.  All other systems reviewed and are otherwise negative except as noted above.    Physical Exam    VS:  BP 120/70   Pulse 70   Ht 5\' 9"  (1.753 m)   Wt 187 lb 12.8 oz (85.2 kg)   SpO2 96%   BMI 27.73 kg/m  , BMI Body mass index is 27.73 kg/m.     GEN: Well nourished, well developed, in no acute distress. HEENT: normal. Neck: Supple, no JVD, carotid bruits, or masses. Cardiac: RRR, no murmurs, rubs, or gallops. No clubbing, cyanosis, edema.  Radials 2+/PT 2+ and equal bilaterally.  Respiratory:  Respirations regular and unlabored, clear to auscultation bilaterally. GI: Soft, nontender, nondistended, BS + x 4. MS: no deformity or atrophy. Skin: warm and dry, no rash. Neuro:  Strength and sensation are intact. Psych: Normal affect.  Accessory Clinical Findings    ECG personally reviewed by me today - EKG Interpretation Date/Time:  Tuesday April 23 2023 08:42:50 EDT Ventricular Rate:  68 PR Interval:  228 QRS Duration:  88 QT Interval:  386 QTC Calculation: 410 R Axis:   -29  Text Interpretation: Sinus rhythm with 1st degree A-V block Septal infarct , age undetermined Confirmed by Nicolasa Ducking [phone]) on 04/23/2023 8:47:49 AM  - no acute changes.  Patient indicates that he recently had labs drawn by his primary care provider.  These are not available today in epic.     Assessment & Plan    1.  Nonobstructive CAD/history of precordial chest pain: Previously evaluated for left lower chest pain that was occurring after meals with some position changes.  Coronary CT angiogram in December 2022 showed nonobstructive disease.  He has done well over the past year, remaining active without symptoms or limitations.  He is on aspirin and statin therapy with lipids followed by primary care.  2.  Primary hypertension:  Blood pressure stable at 120/70.  He remains on lisinopril.  3.  Hyperlipidemia: Remains on statin therapy with lipids followed by primary care.  4.  GERD/reflux esophagitis: Asymptomatic on PPI therapy.  5.  Right renal artery aneurysm: Status post coiling and right renal artery stenting by vascular surgery.  He is on aspirin and Plavix.  Recent renal arterial duplex showed 1 to 59% left renal artery stenosis without any significant right renal artery stenosis.  6.  Disposition: Follow-up in 1 year or sooner if necessary.  Nicolasa Ducking, NP 04/23/2023, 9:08 AM

## 2023-04-23 ENCOUNTER — Encounter: Payer: Self-pay | Admitting: Nurse Practitioner

## 2023-04-23 ENCOUNTER — Ambulatory Visit: Payer: Medicare HMO | Attending: Nurse Practitioner | Admitting: Nurse Practitioner

## 2023-04-23 ENCOUNTER — Ambulatory Visit: Payer: Medicare HMO | Admitting: Nurse Practitioner

## 2023-04-23 VITALS — BP 120/70 | HR 70 | Ht 69.0 in | Wt 187.8 lb

## 2023-04-23 DIAGNOSIS — E782 Mixed hyperlipidemia: Secondary | ICD-10-CM | POA: Diagnosis not present

## 2023-04-23 DIAGNOSIS — I2510 Atherosclerotic heart disease of native coronary artery without angina pectoris: Secondary | ICD-10-CM | POA: Diagnosis not present

## 2023-04-23 DIAGNOSIS — I10 Essential (primary) hypertension: Secondary | ICD-10-CM | POA: Diagnosis not present

## 2023-04-23 NOTE — Patient Instructions (Signed)
Medication Instructions:  Your physician recommends that you continue on your current medications as directed. Please refer to the Current Medication list given to you today.  *If you need a refill on your cardiac medications before your next appointment, please call your pharmacy*   Lab Work: NONE If you have labs (blood work) drawn today and your tests are completely normal, you will receive your results only by: MyChart Message (if you have MyChart) OR A paper copy in the mail If you have any lab test that is abnormal or we need to change your treatment, we will call you to review the results.   Testing/Procedures: EKG   Follow-Up: At [facility], you and your health needs are our priority.  As part of our continuing mission to provide you with exceptional heart care, we have created designated Provider Care Teams.  These Care Teams include your primary Cardiologist (physician) and Advanced Practice Providers (APPs -  Physician Assistants and Nurse Practitioners) who all work together to provide you with the care you need, when you need it.  We recommend signing up for the patient portal called "MyChart".  Sign up information is provided on this After Visit Summary.  MyChart is used to connect with patients for Virtual Visits (Telemedicine).  Patients are able to view lab/test results, encounter notes, upcoming appointments, etc.  Non-urgent messages can be sent to your provider as well.   To learn more about what you can do with MyChart, go to [email or web address].    Your next appointment:   1 year(s)  Provider:   You may see Yvonne Kendall, MD or one of the following Advanced Practice Providers on your designated Care Team:   Nicolasa Ducking, NP

## 2023-07-10 ENCOUNTER — Other Ambulatory Visit (INDEPENDENT_AMBULATORY_CARE_PROVIDER_SITE_OTHER): Payer: Self-pay | Admitting: Vascular Surgery

## 2023-09-10 ENCOUNTER — Other Ambulatory Visit (INDEPENDENT_AMBULATORY_CARE_PROVIDER_SITE_OTHER): Payer: Self-pay

## 2023-09-10 MED ORDER — CLOPIDOGREL BISULFATE 75 MG PO TABS: 75.0000 mg | ORAL_TABLET | Freq: Every day | ORAL | 3 refills | Status: AC

## 2023-09-16 ENCOUNTER — Other Ambulatory Visit (INDEPENDENT_AMBULATORY_CARE_PROVIDER_SITE_OTHER): Payer: Self-pay | Admitting: Vascular Surgery

## 2023-09-16 DIAGNOSIS — I722 Aneurysm of renal artery: Secondary | ICD-10-CM

## 2023-09-16 DIAGNOSIS — I10 Essential (primary) hypertension: Secondary | ICD-10-CM

## 2023-09-17 ENCOUNTER — Ambulatory Visit (INDEPENDENT_AMBULATORY_CARE_PROVIDER_SITE_OTHER): Payer: Medicare HMO

## 2023-09-17 ENCOUNTER — Ambulatory Visit (INDEPENDENT_AMBULATORY_CARE_PROVIDER_SITE_OTHER): Payer: Medicare HMO | Admitting: Vascular Surgery

## 2023-09-17 DIAGNOSIS — I722 Aneurysm of renal artery: Secondary | ICD-10-CM | POA: Diagnosis not present

## 2023-09-17 DIAGNOSIS — I10 Essential (primary) hypertension: Secondary | ICD-10-CM | POA: Diagnosis not present

## 2023-09-27 ENCOUNTER — Encounter (INDEPENDENT_AMBULATORY_CARE_PROVIDER_SITE_OTHER): Payer: Medicare HMO

## 2023-09-27 ENCOUNTER — Ambulatory Visit (INDEPENDENT_AMBULATORY_CARE_PROVIDER_SITE_OTHER): Payer: Medicare HMO | Admitting: Vascular Surgery

## 2023-11-02 IMAGING — CT CT CTA ABD/PEL W/CM AND/OR W/O CM
2 of 9 series · 10 of 46 positions shown, 16 images · IV contrast (APPLIED)
Comparison: CT abdomen pelvis 08/19/2019

CLINICAL DATA: Renal artery aneurysm follow-up

EXAM:
CTA ABDOMEN AND PELVIS WITHOUT AND WITH CONTRAST
TECHNIQUE: Multidetector CT imaging of the abdomen and pelvis was performed
using the standard protocol during bolus administration of
intravenous contrast. Multiplanar reconstructed images and MIPs were
obtained and reviewed to evaluate the vascular anatomy.

[Series 5: axial venous · axial · portal-venous · 0.79mm/px · z∈[-881,-526]mm · 8 of 93 slices shown, 13 images]
[im 11/93  soft-tissue]
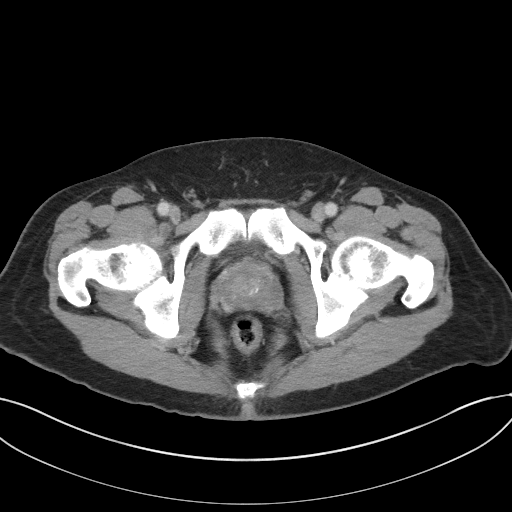
[im 11/93  bone]
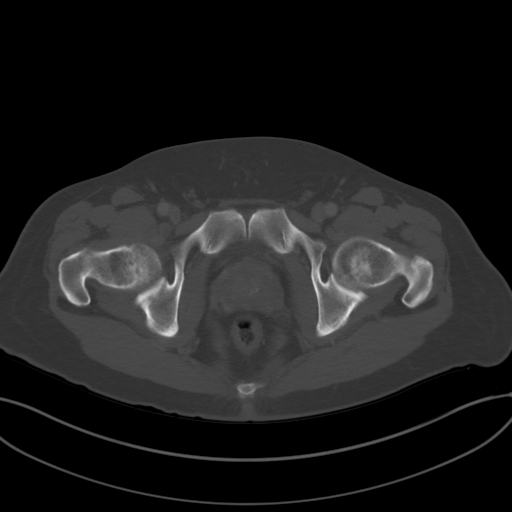
[im 21/93  soft-tissue]
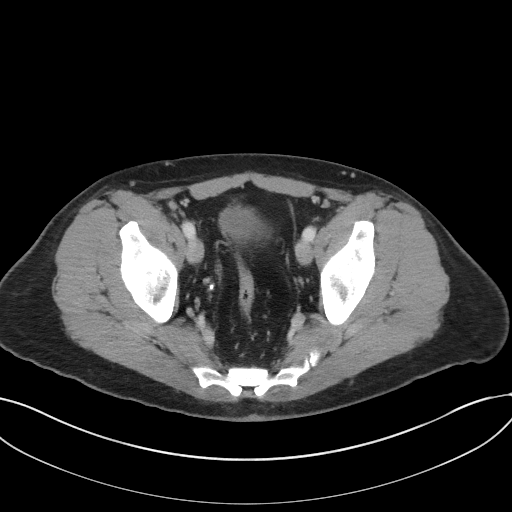
[im 31/93  soft-tissue]
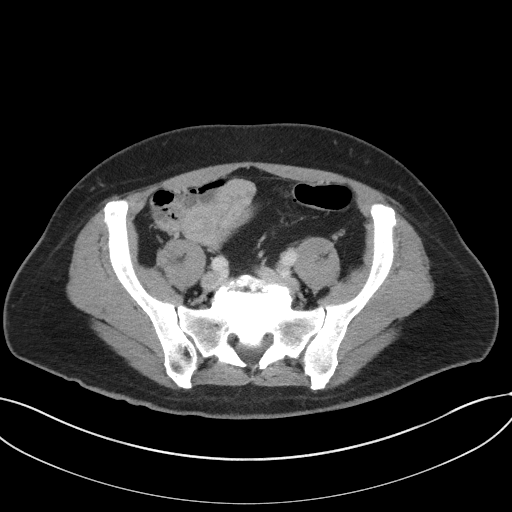
[im 41/93  soft-tissue]
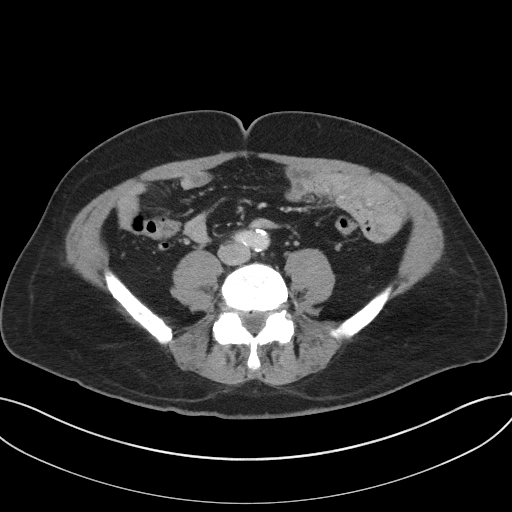
[im 52/93  soft-tissue]
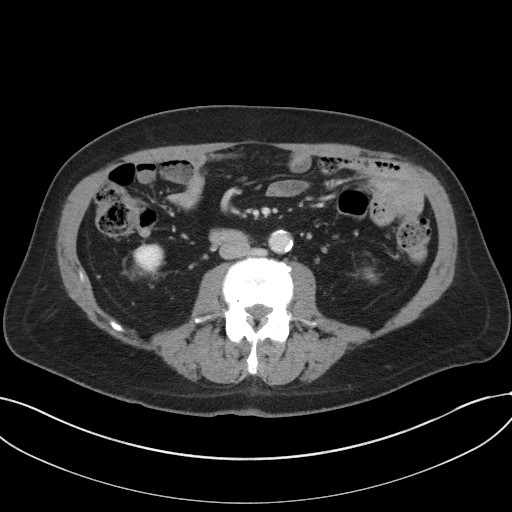
[im 52/93  lung]
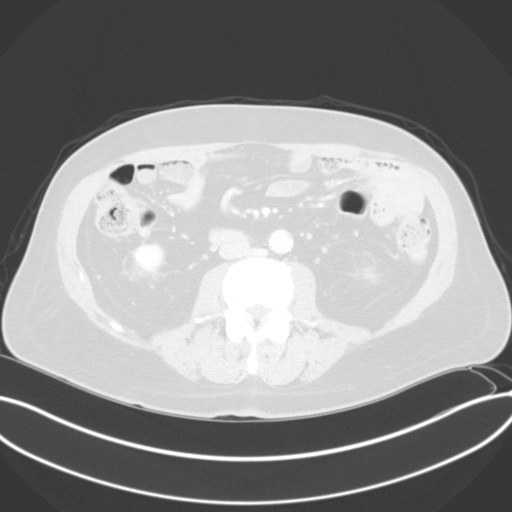
[im 62/93  soft-tissue]
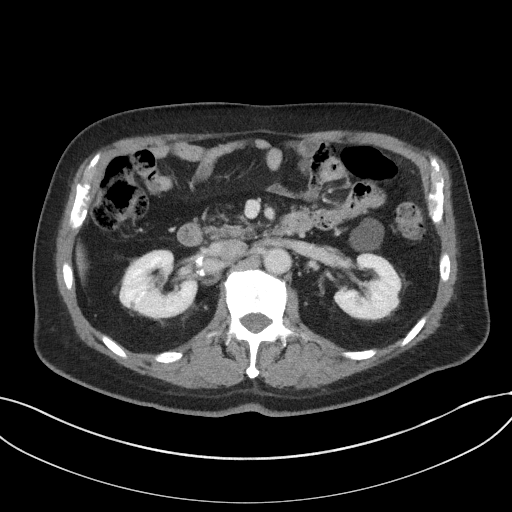
[im 62/93  lung]
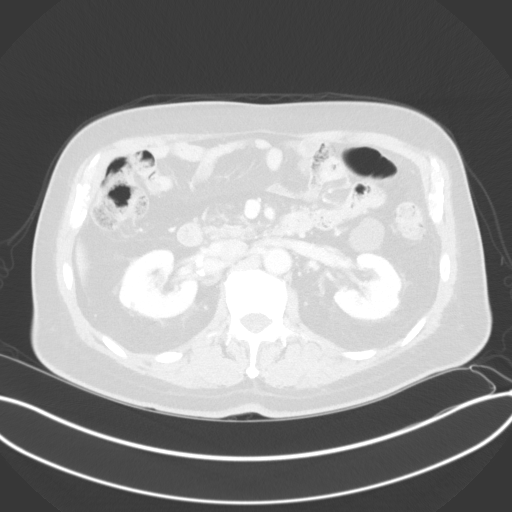
[im 72/93  soft-tissue]
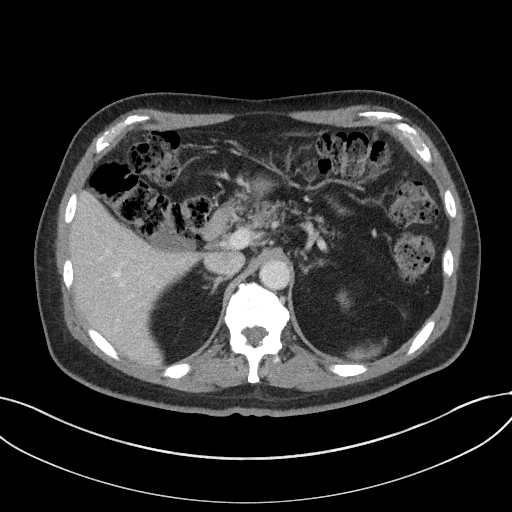
[im 72/93  lung]
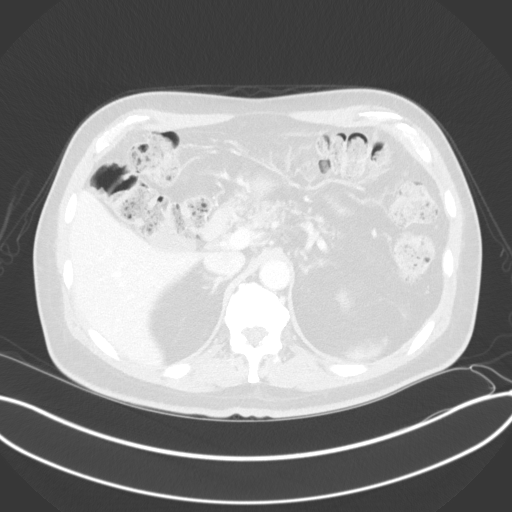
[im 82/93  soft-tissue]
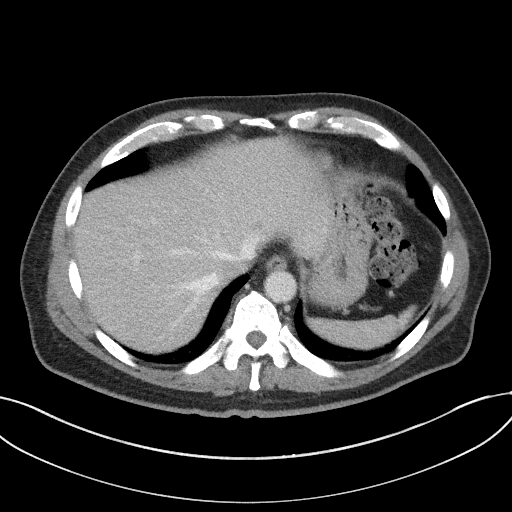
[im 82/93  lung]
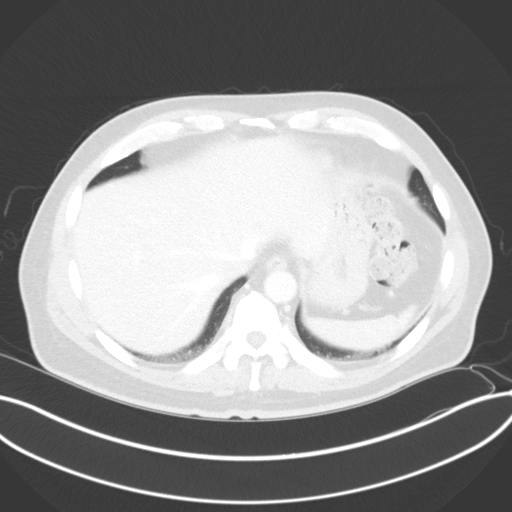

[Series 7: coronal arterial · coronal · arterial · 0.80mm/px · 2 of 84 slices shown, 3 images]
[im 28/84  soft-tissue]
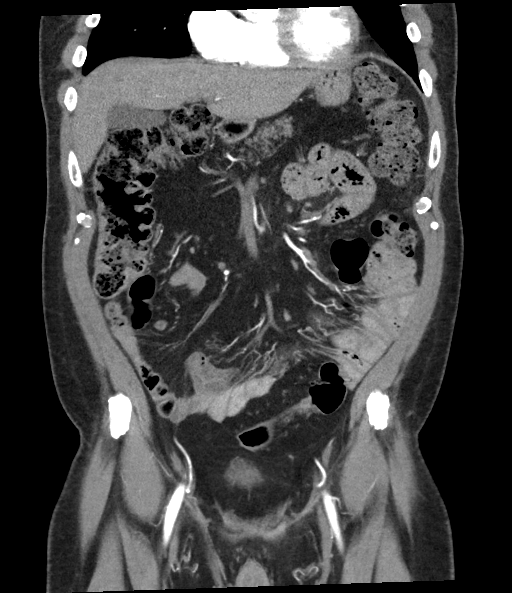
[im 28/84  bone]
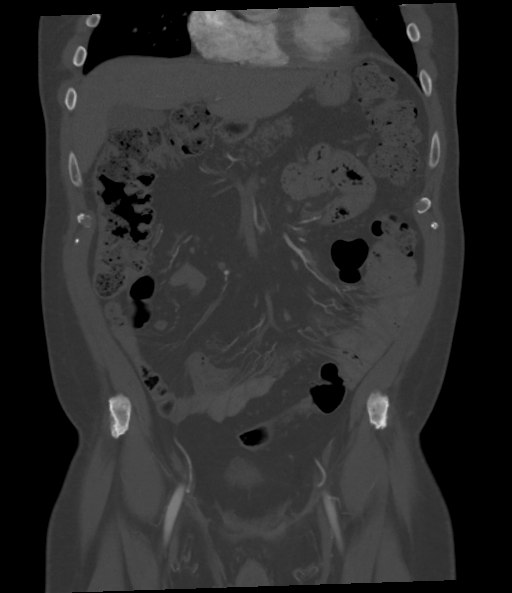
[im 56/84  soft-tissue]
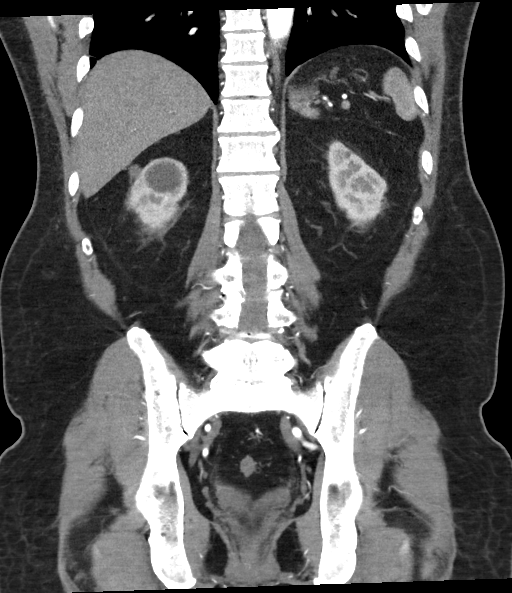

[10 of 46 positions shown; findings below may reference images not displayed]

RADIATION DOSE REDUCTION: This exam was performed according to the
departmental dose-optimization program which includes automated
exposure control, adjustment of the mA and/or kV according to
patient size and/or use of iterative reconstruction technique.

CONTRAST:  100mL OMNIPAQUE IOHEXOL 350 MG/ML SOLN
FINDINGS: VASCULAR

Aorta: Scattered atheromatous calcifications seen throughout the
abdominal aorta without aneurysm or significant stenosis.

Celiac: Patent without evidence of aneurysm, dissection, vasculitis
or significant stenosis.

SMA: Patent without evidence of aneurysm, dissection, vasculitis or
significant stenosis.

Renals: Single bilateral renal arteries are noted. No significant
stenosis or aneurysm of the left main renal artery. Aneurysm
originating from the distal right main renal artery measures 2.5 x
1.7 x 1.7 cm. There are 3 outflow branches and 1 inflow branch. It
is unchanged in size since prior study.

IMA: Patent without evidence of aneurysm, dissection, vasculitis or
significant stenosis.

Inflow: Patent without evidence of aneurysm, dissection, vasculitis
or significant stenosis.

Proximal Outflow: Bilateral common femoral and visualized portions
of the superficial and profunda femoral arteries are patent without
evidence of aneurysm, dissection, vasculitis or significant
stenosis.

Veins: No obvious venous abnormality within the limitations of this
arterial phase study.

Review of the MIP images confirms the above findings.

NON-VASCULAR

Lower chest: No acute abnormality.

Hepatobiliary: 1.5 cm hypodense lesion again seen in the dome of the
right hepatic lobe ([DATE]). This cannot be definitely characterize as
a hemangioma or cyst. Further evaluation with MRI should be
performed. Liver, bile ducts, and gallbladder otherwise
unremarkable.

Pancreas: Unremarkable. No pancreatic ductal dilatation or
surrounding inflammatory changes.

Spleen: Normal in size without focal abnormality.

Adrenals/Urinary Tract: Adrenal glands are normal.

6 mm nonobstructing calculus seen in the upper pole of the right
kidney. Multiple nonobstructing left renal calculi measuring up to 4
mm. No hydronephrosis. Multiple bilateral renal cysts are present
with the largest on the left measuring 3.0 cm in the largest on the
right measuring 3.9 cm. There is a 1.3 cm exophytic lesion extending
laterally from the upper pole the right kidney which cannot be
characterized as a simple cyst.

Nonobstructing calculus again noted within the right distal ureter
measuring 8 mm. Ureters and bladder otherwise unremarkable.

Stomach/Bowel: No bowel dilatation to indicate ileus or obstruction.
Appendix is normal.

Lymphatic: No enlarged abdominal or pelvic lymph nodes.

Reproductive: Mildly enlarged prostate.

Other: No abdominal wall hernia or abnormality. No abdominopelvic
ascites.

Musculoskeletal: No acute or significant osseous findings.
IMPRESSION: VASCULAR

Unchanged aneurysm of the distal right main renal artery measuring
2.5 x 1.7 x 1.7 cm.

NON-VASCULAR

1. 1.5 cm right hepatic dome lesion cannot be completely
characterized as a hemangioma or cyst on the current exam. Further
evaluation with MRI is recommended.
2. 1.3 cm exophytic right upper pole renal lesion cannot be
characterized as a simple cyst. Further evaluation with MRI is
recommended.
3. Nonobstructing bilateral renal calculi. Nonobstructing right
distal ureteral calculus unchanged from prior exam. No
hydronephrosis.

## 2023-11-26 ENCOUNTER — Encounter (INDEPENDENT_AMBULATORY_CARE_PROVIDER_SITE_OTHER): Payer: Self-pay

## 2024-03-10 ENCOUNTER — Ambulatory Visit (INDEPENDENT_AMBULATORY_CARE_PROVIDER_SITE_OTHER): Admitting: Vascular Surgery

## 2024-03-10 ENCOUNTER — Encounter (INDEPENDENT_AMBULATORY_CARE_PROVIDER_SITE_OTHER)

## 2024-03-27 ENCOUNTER — Other Ambulatory Visit (INDEPENDENT_AMBULATORY_CARE_PROVIDER_SITE_OTHER): Payer: Self-pay | Admitting: Vascular Surgery

## 2024-03-27 DIAGNOSIS — I701 Atherosclerosis of renal artery: Secondary | ICD-10-CM

## 2024-03-31 ENCOUNTER — Ambulatory Visit (INDEPENDENT_AMBULATORY_CARE_PROVIDER_SITE_OTHER)

## 2024-03-31 ENCOUNTER — Ambulatory Visit (INDEPENDENT_AMBULATORY_CARE_PROVIDER_SITE_OTHER): Admitting: Vascular Surgery

## 2024-03-31 VITALS — BP 111/76 | HR 63 | Resp 17 | Ht 69.0 in | Wt 190.4 lb

## 2024-03-31 DIAGNOSIS — E785 Hyperlipidemia, unspecified: Secondary | ICD-10-CM | POA: Diagnosis not present

## 2024-03-31 DIAGNOSIS — I10 Essential (primary) hypertension: Secondary | ICD-10-CM | POA: Diagnosis not present

## 2024-03-31 DIAGNOSIS — I701 Atherosclerosis of renal artery: Secondary | ICD-10-CM | POA: Diagnosis not present

## 2024-03-31 DIAGNOSIS — I722 Aneurysm of renal artery: Secondary | ICD-10-CM

## 2024-03-31 NOTE — Progress Notes (Signed)
 MRN : [id number]  Bryan Simpson is a 68 y.o. (Jul 13, 1955) male who presents with chief complaint of  Chief Complaint  Patient presents with   Follow-up  .  History of Present Illness: Patient returns today in follow up of his renal artery aneurysm.  He underwent endovascular repair with a combination of coils and stenting to the main right renal artery about 2-1/2 years ago.  He is doing well.  His blood pressure has been well-controlled.  He has had no known deterioration of his renal function.  His renal artery duplex today shows a patent right renal artery stent and a normal kidney size on each side with no aneurysmal degeneration on the left and no evidence of hemodynamically significant stenosis in either renal artery.  Current Outpatient Medications  Medication Sig Dispense Refill   ASPIRIN  LOW DOSE 81 MG tablet TAKE 1 TABLET BY MOUTH EVERY DAY 120 tablet 3   clopidogrel  (PLAVIX ) 75 MG tablet Take 1 tablet (75 mg total) by mouth daily. 90 tablet 3   lisinopril (ZESTRIL) 5 MG tablet Take 5 mg by mouth daily.     pantoprazole (PROTONIX) 40 MG tablet Take 40 mg by mouth daily.     rosuvastatin (CRESTOR) 10 MG tablet SMARTSIG:1 Tablet(s) By Mouth Every Evening     sildenafil (VIAGRA) 100 MG tablet Take 1 tablet by mouth See admin instructions.     No current facility-administered medications for this visit.    Past Medical History:  Diagnosis Date   Aneurysm of right renal artery    a. 08/2021 s/p Coiling (5 Ruby coils) and 6mm x 5cm Viabahn stent.   Colon polyps 2009   Hypertension    Mixed hyperlipidemia    Nephrolithiasis    Non-obstructive CAD (coronary artery disease)    a. 08/04/2020 Coronary CTA: LM nl.  LAD proximal and mid calcification with less than 25% stenosis.  Nondominant LCx without plaque.  Dominant RCA with minimal calcification and less than 25% stenosis.  CAC score 253 (69th percentile).   Precordial chest pain ([facility] vs [facility])     Past Surgical History:   Procedure Laterality Date   COLONOSCOPY  2009   COLONOSCOPY WITH PROPOFOL  N/A 02/15/2021   Procedure: COLONOSCOPY WITH PROPOFOL ;  Surgeon: Dessa Reyes ORN, MD;  Location: [facility];  Service: Endoscopy;  Laterality: N/A;   ESOPHAGOGASTRODUODENOSCOPY (EGD) WITH PROPOFOL  N/A 02/15/2021   Procedure: ESOPHAGOGASTRODUODENOSCOPY (EGD) WITH PROPOFOL ;  Surgeon: Dessa Reyes ORN, MD;  Location: [facility];  Service: Endoscopy;  Laterality: N/A;   RENAL ANGIOGRAPHY N/A 09/04/2021   Procedure: RENAL ANGIOGRAPHY;  Surgeon: Marea Selinda RAMAN, MD;  Location: [facility];  Service: Cardiovascular;  Laterality: N/A;   TONSILLECTOMY  age 27     Social History   Tobacco Use   Smoking status: Never   Smokeless tobacco: Never  Vaping Use   Vaping status: Never Used  Substance Use Topics   Alcohol use: Yes    Alcohol/week: 1.0 standard drink of alcohol    Types: 1 Glasses of wine per week   Drug use: No       Family History  Problem Relation Age of Onset   Heart Problems Father 62       Heart failure     No Known Allergies   REVIEW OF SYSTEMS (Negative unless checked)  Constitutional: [] Weight loss  [] Fever  [] Chills Cardiac: [] Chest pain   [] Chest pressure   [] Palpitations   [] Shortness of breath when laying flat   []   Shortness of breath at rest   [] Shortness of breath with exertion. Vascular:  [] Pain in legs with walking   [] Pain in legs at rest   [] Pain in legs when laying flat   [] Claudication   [] Pain in feet when walking  [] Pain in feet at rest  [] Pain in feet when laying flat   [] History of DVT   [] Phlebitis   [] Swelling in legs   [] Varicose veins   [] Non-healing ulcers Pulmonary:   [] Uses home oxygen   [] Productive cough   [] Hemoptysis   [] Wheeze  [] COPD   [] Asthma Neurologic:  [] Dizziness  [] Blackouts   [] Seizures   [] History of stroke   [] History of TIA  [] Aphasia   [] Temporary blindness   [] Dysphagia   [] Weakness or numbness in arms   [] Weakness or numbness in  legs Musculoskeletal:  [x] Arthritis   [] Joint swelling   [] Joint pain   [] Low back pain Hematologic:  [x] Easy bruising  [] Easy bleeding   [] Hypercoagulable state   [] Anemic   Gastrointestinal:  [] Blood in stool   [] Vomiting blood  [] Gastroesophageal reflux/heartburn   [] Abdominal pain Genitourinary:  [] Chronic kidney disease   [] Difficult urination  [] Frequent urination  [] Burning with urination   [] Hematuria Skin:  [] Rashes   [] Ulcers   [] Wounds Psychological:  [] History of anxiety   []  History of major depression.  Physical Examination  BP 111/76   Pulse 63   Resp 17   Ht 5' 9 (1.753 m)   Wt 190 lb 6.4 oz (86.4 kg)   BMI 28.12 kg/m  Gen:  WD/WN, NAD. Appears younger than stated age. Head: [place]/AT, No temporalis wasting. Ear/Nose/Throat: Hearing grossly intact, nares w/o erythema or drainage Eyes: Conjunctiva clear. Sclera non-icteric Neck: Supple.  Trachea midline Pulmonary:  Good air movement, no use of accessory muscles.  Cardiac: RRR, no JVD Vascular:  Vessel Right Left  Radial Palpable Palpable                                   Gastrointestinal: soft, non-tender/non-distended. No guarding/reflex.  Musculoskeletal: M/S 5/5 throughout.  No deformity or atrophy. No edema. Neurologic: Sensation grossly intact in extremities.  Symmetrical.  Speech is fluent.  Psychiatric: Judgment intact, Mood & affect appropriate for pt's clinical situation. Dermatologic: No rashes or ulcers noted.  No cellulitis or open wounds.      Labs No results found for this or any previous visit (from the past 2160 hours).  Radiology No results found.  Assessment/Plan  Aneurysm of right renal artery His renal artery duplex today shows a patent right renal artery stent and a normal kidney size on each side with no aneurysmal degeneration on the left and no evidence of hemodynamically significant stenosis in either renal artery. He is doing well.  He continues on aspirin , Plavix , and  Crestor.  I think at this point we can go to an annual follow-up with duplex.  He will contact our office with any deterioration in renal function or worsening blood pressure issues.  Essential hypertension blood pressure control important in reducing the progression of atherosclerotic disease. On appropriate oral medications.     Hyperlipidemia lipid control important in reducing the progression of atherosclerotic disease. Continue statin therapy  Selinda Gu, MD  03/31/2024 10:10 AM    This note was created with Dragon medical transcription system.  Any errors from dictation are purely unintentional

## 2024-03-31 NOTE — Assessment & Plan Note (Signed)
 His renal artery duplex today shows a patent right renal artery stent and a normal kidney size on each side with no aneurysmal degeneration on the left and no evidence of hemodynamically significant stenosis in either renal artery. He is doing well.  He continues on aspirin , Plavix , and Crestor.  I think at this point we can go to an annual follow-up with duplex.  He will contact our office with any deterioration in renal function or worsening blood pressure issues.

## 2025-03-30 ENCOUNTER — Ambulatory Visit (INDEPENDENT_AMBULATORY_CARE_PROVIDER_SITE_OTHER): Admitting: Vascular Surgery

## 2025-03-30 ENCOUNTER — Encounter (INDEPENDENT_AMBULATORY_CARE_PROVIDER_SITE_OTHER)
